# Patient Record
Sex: Female | Born: 1965 | Race: Black or African American | Hispanic: No | Marital: Married | Smoking: Never smoker
Health system: Southern US, Community
[De-identification: ages and names within clinical notes are randomized; demographics above are authoritative.]

## PROBLEM LIST (undated history)

## (undated) DIAGNOSIS — R51 Headache: Secondary | ICD-10-CM

## (undated) HISTORY — DX: Headache: R51

## (undated) HISTORY — PX: TUBAL LIGATION: SHX77

---

## 1997-08-10 ENCOUNTER — Ambulatory Visit (HOSPITAL_COMMUNITY): Admission: RE | Admit: 1997-08-10 | Discharge: 1997-08-10 | Payer: Self-pay | Admitting: Obstetrics

## 1997-12-13 ENCOUNTER — Inpatient Hospital Stay (HOSPITAL_COMMUNITY): Admission: AD | Admit: 1997-12-13 | Discharge: 1997-12-13 | Payer: Self-pay | Admitting: Obstetrics

## 1998-07-22 ENCOUNTER — Other Ambulatory Visit: Admission: RE | Admit: 1998-07-22 | Discharge: 1998-07-22 | Payer: Self-pay | Admitting: Obstetrics

## 1999-06-22 ENCOUNTER — Other Ambulatory Visit: Admission: RE | Admit: 1999-06-22 | Discharge: 1999-06-22 | Payer: Self-pay | Admitting: Obstetrics

## 1999-06-26 ENCOUNTER — Emergency Department (HOSPITAL_COMMUNITY): Admission: EM | Admit: 1999-06-26 | Discharge: 1999-06-26 | Payer: Self-pay | Admitting: *Deleted

## 2000-01-13 ENCOUNTER — Encounter: Payer: Self-pay | Admitting: Internal Medicine

## 2000-05-03 ENCOUNTER — Emergency Department (HOSPITAL_COMMUNITY): Admission: EM | Admit: 2000-05-03 | Discharge: 2000-05-03 | Payer: Self-pay | Admitting: Emergency Medicine

## 2000-08-09 ENCOUNTER — Encounter: Payer: Self-pay | Admitting: Family Medicine

## 2000-08-09 ENCOUNTER — Ambulatory Visit (HOSPITAL_COMMUNITY): Admission: RE | Admit: 2000-08-09 | Discharge: 2000-08-09 | Payer: Self-pay | Admitting: Family Medicine

## 2000-08-13 ENCOUNTER — Ambulatory Visit (HOSPITAL_COMMUNITY): Admission: RE | Admit: 2000-08-13 | Discharge: 2000-08-13 | Payer: Self-pay | Admitting: Family Medicine

## 2000-08-13 ENCOUNTER — Encounter: Payer: Self-pay | Admitting: Family Medicine

## 2000-10-09 ENCOUNTER — Encounter: Payer: Self-pay | Admitting: Obstetrics

## 2000-10-09 ENCOUNTER — Encounter: Admission: RE | Admit: 2000-10-09 | Discharge: 2000-10-09 | Payer: Self-pay | Admitting: Obstetrics

## 2000-10-31 ENCOUNTER — Encounter: Payer: Self-pay | Admitting: Internal Medicine

## 2001-03-21 ENCOUNTER — Encounter: Payer: Self-pay | Admitting: Family Medicine

## 2001-03-21 ENCOUNTER — Encounter: Admission: RE | Admit: 2001-03-21 | Discharge: 2001-03-21 | Payer: Self-pay | Admitting: Family Medicine

## 2001-10-23 ENCOUNTER — Encounter: Admission: RE | Admit: 2001-10-23 | Discharge: 2001-10-23 | Payer: Self-pay | Admitting: General Surgery

## 2001-10-23 ENCOUNTER — Encounter (HOSPITAL_BASED_OUTPATIENT_CLINIC_OR_DEPARTMENT_OTHER): Payer: Self-pay | Admitting: General Surgery

## 2002-03-12 ENCOUNTER — Encounter: Payer: Self-pay | Admitting: Internal Medicine

## 2002-03-26 ENCOUNTER — Encounter: Payer: Self-pay | Admitting: Internal Medicine

## 2002-03-28 ENCOUNTER — Encounter: Payer: Self-pay | Admitting: Internal Medicine

## 2002-04-18 ENCOUNTER — Encounter: Payer: Self-pay | Admitting: Internal Medicine

## 2002-06-09 ENCOUNTER — Encounter: Payer: Self-pay | Admitting: Obstetrics

## 2002-06-09 ENCOUNTER — Ambulatory Visit (HOSPITAL_COMMUNITY): Admission: RE | Admit: 2002-06-09 | Discharge: 2002-06-09 | Payer: Self-pay | Admitting: Obstetrics

## 2002-06-30 ENCOUNTER — Inpatient Hospital Stay (HOSPITAL_COMMUNITY): Admission: AD | Admit: 2002-06-30 | Discharge: 2002-06-30 | Payer: Self-pay | Admitting: Obstetrics

## 2002-07-07 ENCOUNTER — Inpatient Hospital Stay (HOSPITAL_COMMUNITY): Admission: AD | Admit: 2002-07-07 | Discharge: 2002-07-07 | Payer: Self-pay | Admitting: Obstetrics

## 2002-07-14 ENCOUNTER — Inpatient Hospital Stay (HOSPITAL_COMMUNITY): Admission: RE | Admit: 2002-07-14 | Discharge: 2002-07-14 | Payer: Self-pay | Admitting: Obstetrics

## 2002-07-14 ENCOUNTER — Encounter: Payer: Self-pay | Admitting: Obstetrics

## 2002-07-21 ENCOUNTER — Inpatient Hospital Stay (HOSPITAL_COMMUNITY): Admission: AD | Admit: 2002-07-21 | Discharge: 2002-07-21 | Payer: Self-pay | Admitting: Obstetrics

## 2002-07-23 ENCOUNTER — Encounter: Payer: Self-pay | Admitting: Obstetrics

## 2002-07-23 ENCOUNTER — Ambulatory Visit (HOSPITAL_COMMUNITY): Admission: RE | Admit: 2002-07-23 | Discharge: 2002-07-23 | Payer: Self-pay | Admitting: Obstetrics

## 2002-08-27 ENCOUNTER — Inpatient Hospital Stay (HOSPITAL_COMMUNITY): Admission: AD | Admit: 2002-08-27 | Discharge: 2002-08-27 | Payer: Self-pay | Admitting: Obstetrics

## 2002-09-24 ENCOUNTER — Inpatient Hospital Stay (HOSPITAL_COMMUNITY): Admission: AD | Admit: 2002-09-24 | Discharge: 2002-09-24 | Payer: Self-pay | Admitting: Obstetrics

## 2002-09-25 ENCOUNTER — Inpatient Hospital Stay (HOSPITAL_COMMUNITY): Admission: AD | Admit: 2002-09-25 | Discharge: 2002-09-25 | Payer: Self-pay | Admitting: Obstetrics

## 2002-10-19 ENCOUNTER — Inpatient Hospital Stay (HOSPITAL_COMMUNITY): Admission: AD | Admit: 2002-10-19 | Discharge: 2002-10-19 | Payer: Self-pay | Admitting: Obstetrics

## 2002-11-12 ENCOUNTER — Inpatient Hospital Stay (HOSPITAL_COMMUNITY): Admission: AD | Admit: 2002-11-12 | Discharge: 2002-11-12 | Payer: Self-pay | Admitting: Obstetrics

## 2002-11-14 ENCOUNTER — Ambulatory Visit (HOSPITAL_COMMUNITY): Admission: RE | Admit: 2002-11-14 | Discharge: 2002-11-14 | Payer: Self-pay | Admitting: Obstetrics

## 2002-11-14 ENCOUNTER — Encounter: Payer: Self-pay | Admitting: Obstetrics

## 2002-12-02 ENCOUNTER — Inpatient Hospital Stay (HOSPITAL_COMMUNITY): Admission: AD | Admit: 2002-12-02 | Discharge: 2002-12-02 | Payer: Self-pay | Admitting: Obstetrics

## 2002-12-11 ENCOUNTER — Inpatient Hospital Stay (HOSPITAL_COMMUNITY): Admission: AD | Admit: 2002-12-11 | Discharge: 2002-12-11 | Payer: Self-pay | Admitting: Obstetrics

## 2002-12-19 ENCOUNTER — Inpatient Hospital Stay (HOSPITAL_COMMUNITY): Admission: AD | Admit: 2002-12-19 | Discharge: 2002-12-22 | Payer: Self-pay | Admitting: Obstetrics

## 2002-12-19 ENCOUNTER — Encounter (INDEPENDENT_AMBULATORY_CARE_PROVIDER_SITE_OTHER): Payer: Self-pay | Admitting: Specialist

## 2003-07-29 ENCOUNTER — Encounter: Admission: RE | Admit: 2003-07-29 | Discharge: 2003-07-29 | Payer: Self-pay | Admitting: Obstetrics

## 2003-09-14 ENCOUNTER — Emergency Department (HOSPITAL_COMMUNITY): Admission: EM | Admit: 2003-09-14 | Discharge: 2003-09-14 | Payer: Self-pay | Admitting: Emergency Medicine

## 2004-03-29 ENCOUNTER — Ambulatory Visit: Payer: Self-pay | Admitting: Internal Medicine

## 2006-06-08 ENCOUNTER — Emergency Department (HOSPITAL_COMMUNITY): Admission: EM | Admit: 2006-06-08 | Discharge: 2006-06-08 | Payer: Self-pay | Admitting: Emergency Medicine

## 2006-07-05 ENCOUNTER — Encounter: Admission: RE | Admit: 2006-07-05 | Discharge: 2006-07-05 | Payer: Self-pay | Admitting: Internal Medicine

## 2006-07-05 ENCOUNTER — Ambulatory Visit: Payer: Self-pay | Admitting: Internal Medicine

## 2006-07-06 ENCOUNTER — Ambulatory Visit: Payer: Self-pay | Admitting: Internal Medicine

## 2006-07-06 LAB — CONVERTED CEMR LAB
AST: 14 units/L (ref 0–37)
Albumin: 3.4 g/dL — ABNORMAL LOW (ref 3.5–5.2)
Alkaline Phosphatase: 42 units/L (ref 39–117)
Bilirubin Urine: NEGATIVE
CO2: 31 meq/L (ref 19–32)
Calcium: 8.7 mg/dL (ref 8.4–10.5)
Cholesterol: 194 mg/dL (ref 0–200)
Creatinine, Ser: 0.8 mg/dL (ref 0.4–1.2)
Glucose, Bld: 95 mg/dL (ref 70–99)
HDL: 41.5 mg/dL (ref >39.0)
Hemoglobin: 12 g/dL (ref 12.0–15.0)
Hgb A1c MFr Bld: 5.8 % (ref 4.6–6.0)
LDL Cholesterol: 128 mg/dL — ABNORMAL HIGH (ref 0–99)
Lymphocytes Relative: 28.6 % (ref 12.0–46.0)
MCV: 81.2 fL (ref 78.0–100.0)
Neutro Abs: 3.8 10*3/uL (ref 1.4–7.7)
Platelets: 255 10*3/uL (ref 150–400)
Potassium: 3.9 meq/L (ref 3.5–5.1)
RBC: 4.26 M/uL (ref 3.87–5.11)
Total CHOL/HDL Ratio: 4.7
Total Protein: 7.3 g/dL (ref 6.0–8.3)
Triglycerides: 125 mg/dL (ref 0–149)
Urine Glucose: NEGATIVE mg/dL
VLDL: 25 mg/dL (ref 0–40)
WBC: 6.2 10*3/uL (ref 4.5–10.5)
pH: 6 (ref 5.0–8.0)

## 2006-07-17 ENCOUNTER — Ambulatory Visit: Payer: Self-pay | Admitting: Internal Medicine

## 2006-07-31 ENCOUNTER — Ambulatory Visit (HOSPITAL_COMMUNITY): Admission: RE | Admit: 2006-07-31 | Discharge: 2006-07-31 | Payer: Self-pay | Admitting: Neurology

## 2006-08-03 ENCOUNTER — Ambulatory Visit (HOSPITAL_COMMUNITY): Admission: RE | Admit: 2006-08-03 | Discharge: 2006-08-03 | Payer: Self-pay | Admitting: Neurology

## 2006-08-20 ENCOUNTER — Encounter: Payer: Self-pay | Admitting: Internal Medicine

## 2006-12-08 ENCOUNTER — Emergency Department (HOSPITAL_COMMUNITY): Admission: EM | Admit: 2006-12-08 | Discharge: 2006-12-08 | Payer: Self-pay | Admitting: Emergency Medicine

## 2006-12-27 ENCOUNTER — Ambulatory Visit: Payer: Self-pay | Admitting: Internal Medicine

## 2007-01-24 ENCOUNTER — Telehealth: Payer: Self-pay | Admitting: Internal Medicine

## 2007-02-01 ENCOUNTER — Encounter: Payer: Self-pay | Admitting: Internal Medicine

## 2007-02-01 DIAGNOSIS — R519 Headache, unspecified: Secondary | ICD-10-CM | POA: Insufficient documentation

## 2007-02-01 DIAGNOSIS — R51 Headache: Secondary | ICD-10-CM | POA: Insufficient documentation

## 2007-08-17 ENCOUNTER — Inpatient Hospital Stay (HOSPITAL_COMMUNITY): Admission: AD | Admit: 2007-08-17 | Discharge: 2007-08-17 | Payer: Self-pay | Admitting: Obstetrics

## 2007-12-20 ENCOUNTER — Ambulatory Visit (HOSPITAL_BASED_OUTPATIENT_CLINIC_OR_DEPARTMENT_OTHER): Admission: RE | Admit: 2007-12-20 | Discharge: 2007-12-20 | Payer: Self-pay | Admitting: Internal Medicine

## 2007-12-20 ENCOUNTER — Emergency Department (HOSPITAL_BASED_OUTPATIENT_CLINIC_OR_DEPARTMENT_OTHER): Admission: EM | Admit: 2007-12-20 | Discharge: 2007-12-20 | Payer: Self-pay | Admitting: Emergency Medicine

## 2007-12-20 ENCOUNTER — Ambulatory Visit: Payer: Self-pay | Admitting: Internal Medicine

## 2007-12-20 DIAGNOSIS — R0602 Shortness of breath: Secondary | ICD-10-CM | POA: Insufficient documentation

## 2007-12-20 DIAGNOSIS — H9209 Otalgia, unspecified ear: Secondary | ICD-10-CM | POA: Insufficient documentation

## 2007-12-20 DIAGNOSIS — R5381 Other malaise: Secondary | ICD-10-CM

## 2007-12-20 DIAGNOSIS — R5383 Other fatigue: Secondary | ICD-10-CM | POA: Insufficient documentation

## 2007-12-20 LAB — CONVERTED CEMR LAB
AST: 17 units/L (ref 0–37)
Basophils Absolute: 0 10*3/uL (ref 0.0–0.1)
Calcium: 8.6 mg/dL (ref 8.4–10.5)
Creatinine, Ser: 0.8 mg/dL (ref 0.4–1.2)
Eosinophils Absolute: 0.1 10*3/uL (ref 0.0–0.7)
HCT: 38.7 % (ref 36.0–46.0)
Hemoglobin: 13.2 g/dL (ref 12.0–15.0)
Lymphocytes Relative: 31.6 % (ref 12.0–46.0)
MCHC: 34.1 g/dL (ref 30.0–36.0)
Neutro Abs: 4.1 10*3/uL (ref 1.4–7.7)
Platelets: 291 10*3/uL (ref 150–400)
Potassium: 3.7 meq/L (ref 3.5–5.1)
RBC: 4.7 M/uL (ref 3.87–5.11)
TSH: 1.44 microintl units/mL (ref 0.35–5.50)
WBC: 6.7 10*3/uL (ref 4.5–10.5)

## 2008-05-21 ENCOUNTER — Emergency Department (HOSPITAL_COMMUNITY): Admission: EM | Admit: 2008-05-21 | Discharge: 2008-05-21 | Payer: Self-pay | Admitting: Emergency Medicine

## 2008-08-10 ENCOUNTER — Emergency Department (HOSPITAL_COMMUNITY): Admission: EM | Admit: 2008-08-10 | Discharge: 2008-08-10 | Payer: Self-pay | Admitting: Emergency Medicine

## 2009-06-02 ENCOUNTER — Ambulatory Visit: Payer: Self-pay | Admitting: Internal Medicine

## 2009-06-02 DIAGNOSIS — E669 Obesity, unspecified: Secondary | ICD-10-CM | POA: Insufficient documentation

## 2009-06-02 DIAGNOSIS — M722 Plantar fascial fibromatosis: Secondary | ICD-10-CM | POA: Insufficient documentation

## 2009-06-02 DIAGNOSIS — R609 Edema, unspecified: Secondary | ICD-10-CM | POA: Insufficient documentation

## 2009-06-02 LAB — CONVERTED CEMR LAB
BUN: 10 mg/dL (ref 6–23)
Calcium: 8.7 mg/dL (ref 8.4–10.5)
Glucose, Urine, Semiquant: NEGATIVE
Ketones, urine, test strip: NEGATIVE
Potassium: 4 meq/L (ref 3.5–5.3)
Sodium: 140 meq/L (ref 135–145)
WBC Urine, dipstick: NEGATIVE
pH: 5

## 2009-06-03 ENCOUNTER — Encounter: Payer: Self-pay | Admitting: Internal Medicine

## 2009-09-20 ENCOUNTER — Inpatient Hospital Stay (HOSPITAL_COMMUNITY): Admission: AD | Admit: 2009-09-20 | Discharge: 2009-09-20 | Payer: Self-pay | Admitting: Obstetrics & Gynecology

## 2009-09-20 ENCOUNTER — Ambulatory Visit: Payer: Self-pay | Admitting: Obstetrics and Gynecology

## 2010-03-31 ENCOUNTER — Ambulatory Visit (INDEPENDENT_AMBULATORY_CARE_PROVIDER_SITE_OTHER)
Admission: RE | Admit: 2010-03-31 | Discharge: 2010-03-31 | Payer: BC Managed Care – PPO | Source: Home / Self Care | Attending: Internal Medicine | Admitting: Internal Medicine

## 2010-03-31 DIAGNOSIS — H9209 Otalgia, unspecified ear: Secondary | ICD-10-CM

## 2010-03-31 DIAGNOSIS — R7309 Other abnormal glucose: Secondary | ICD-10-CM

## 2010-03-31 LAB — HM MAMMOGRAPHY

## 2010-03-31 LAB — CONVERTED CEMR LAB

## 2010-04-05 NOTE — Letter (Signed)
   [place] [facility] [address] [place], [place]  [address]  [place] Phone: [phone]      June 03, 2009   [place] [address] [place], [place] [address]  RE:  LAB RESULTS  Dear  Ms. Horsman,  The following is an interpretation of your most recent lab tests.  Please take note of any instructions provided or changes to medications that have resulted from your lab work.  ELECTROLYTES:  Good - no changes needed     Diabetes blood test - HgA1c 6.0  (borderline)       Sincerely Yours,    Dr. Thomos Lemons

## 2010-04-05 NOTE — Assessment & Plan Note (Signed)
Summary: Multiple complaints /mhf   Vital Signs:  Patient profile:   45 year old female Height:      66 inches Weight:      265.50 pounds BMI:     43.01 O2 Sat:      99 % on Room air Temp:     98.0 degrees F oral Pulse rate:   54 / minute Pulse rhythm:   regular Resp:     16 per minute BP sitting:   114 / 70  (right arm) Cuff size:   large  Vitals Entered By: Glendell Docker CMA (June 02, 2009 8:48 AM)  O2 Flow:  Room air CC: Rm 2-Re-establish care   Primary Care Provider:  DThomos Lemons DO  CC:  Rm 2-Re-establish care.  History of Present Illness: 45 y/o AA female - we haven't seen over 2 yrs  she complains of left foot pain localized to heel.  worse with wt bearing. she also complains of bilateral leg swelling  20 lbs wt gain since prev visit.  she c/o urinary frequency occasional right pelvic discomfort    Preventive Screening-Counseling & Management  Alcohol-Tobacco     Alcohol drinks/day: 0     Smoking Status: never  Caffeine-Diet-Exercise     Caffeine use/day: 1 per week     Does Patient Exercise: no  Allergies (verified): No Known Drug Allergies  Past History:  Past Medical History: Hx of severe HA - with abnormal MRI ? 2006/07/19       Tubular flow signal void in the region of the left sigmoid sinus.        ? filling defect within the left transverse sinus such as due to clot.   S/P Cerebral angio -       1.  Normal-appearing arterial phase.      2.  Early venous and delayed venous phases demonstrating no evidence      of dural venous sinus thrombus or dural venous sinus narrowing.      Normal hemodynamic progression of contrast from the capillary and      venous phases. Hx of SOB - cardiology evaluation 07-19-02 [facility])   - normal stress test - EF 60% (Dr. Fraser Din)   - normal 2D Echo.  mild pulmonic insuff,  trivial tricuspit regurg   Negative CT of Chest for PE 07/19/2007  Past Surgical History: Caesarean section  Tubal  ligation  Family History: DM - mother CAD - Father , sister   Social History: Widowed - 07/19/07 Husband died of liver failure 1 daughter, 2 sons works part time in Theatre stage manager - Event organiser Never Smoked Alcohol use-no Caffeine use/day:  1 per week Does Patient Exercise:  no  Review of Systems       The patient complains of weight gain.  The patient denies chest pain, prolonged cough, abdominal pain, melena, hematochezia, and severe indigestion/heartburn.    Physical Exam  General:  alert and overweight-appearing.   Head:  normocephalic and atraumatic.   Neck:  supple and no masses.   Lungs:  normal respiratory effort, normal breath sounds, no crackles, and no wheezes.   Heart:  normal rate, regular rhythm, and no gallop.   Abdomen:  obese,  mild RLQ pain,  no masses, no guarding, no rigidity, and no rebound tenderness.   Msk:  left heel tendnerness.  left ankle - no joint swelling, no joint warmth, and no joint instability.   Extremities:  1+ left pedal edema and  1+ right pedal edema.  no calf tenderness   Impression & Recommendations:  Problem # 1:  PLANTAR FASCIITIS (ICD-728.71) 45 y/o with left foot pain c/w plantar fascitis.  I reviewed stretching exercises and use of shoe inserts.    Problem # 2:  OBESITY (ICD-278.00) I counseled pt on diet and exercise.  screen for diabetes.    Orders: T-Basic Metabolic Panel [phone]) T- Hemoglobin A1C ([id number]) T-TSH ([id number])  Problem # 3:  EDEMA (ICD-782.3) Pt with lower ext edema.  I suspect secondary to venous insuff and obesity.   follow low salt diet.  Other Orders: Misc. Referral (Misc. Ref) UA Dipstick w/o Micro (manual) ([id number])  Patient Instructions: 1)  Perform left foot stretching exercises as directed. 2)  Wear shoes with good arch support. 3)  [email or web address]-calorie-counter.com/ 4)  Limit your calories to approx 1300 cal per day. 5)  Our office will contact you re:  referral to health  serve clinic 6)  Restrict sodium intake to 2 - 2.5 grams per day.  Current Allergies (reviewed today): No known allergies        Laboratory Results   Urine Tests    Routine Urinalysis   Color: yellow Appearance: Clear Glucose: negative   (Normal Range: Negative) Bilirubin: negative   (Normal Range: Negative) Ketone: negative   (Normal Range: Negative) Spec. Gravity: 1.015   (Normal Range: 1.003-1.035) Blood: negative   (Normal Range: Negative) pH: 5.0   (Normal Range: 5.0-8.0) Protein: negative   (Normal Range: Negative) Urobilinogen: 0.2   (Normal Range: 0-1) Nitrite: negative   (Normal Range: Negative) Leukocyte Esterace: negative   (Normal Range: Negative)

## 2010-04-21 NOTE — Assessment & Plan Note (Signed)
Summary: Pt need wt loss help/knee pain/hea   Vital Signs:  Patient profile:   45 year old female Height:      66 inches Weight:      260 pounds BMI:     42.12 O2 Sat:      99 % on Room air Temp:     97.9 degrees F oral Pulse rate:   64 / minute Resp:     16 per minute BP sitting:   100 / 70  (left arm) Cuff size:   large  Vitals Entered By: Glendell Docker CMA (March 31, 2010 4:01 PM)  O2 Flow:  Room air CC: Help with Weight Is Patient Diabetic? No Pain Assessment Patient in pain? no          Last PAP Result due   Primary Care Provider:  Dondra Spry DO  CC:  Help with Weight.  History of Present Illness:  45 y/o AA female for f/u pt has been trying to lose wt by diet and exercise but little progress she is not ready to consider wt loss surgery pt with multiple fam members with severe obesity  pt c/o chronic left knee pain   she also c/o rattling in left ear for the past 2 weeks she has seen ent in the past.  prev procedure delayed by pt  Preventive Screening-Counseling & Management  Alcohol-Tobacco     Smoking Status: never  Allergies (verified): No Known Drug Allergies  Past History:  Past Medical History: Hx of severe HA - with abnormal MRI ? 07/2006       Tubular flow signal void in the region of the left sigmoid sinus.        ? filling defect within the left transverse sinus such as due to clot.   S/P Cerebral angio -        1.  Normal-appearing arterial phase.      2.  Early venous and delayed venous phases demonstrating no evidence      of dural venous sinus thrombus or dural venous sinus narrowing.      Normal hemodynamic progression of contrast from the capillary and      venous phases. Hx of SOB - cardiology evaluation 2004 [facility])   - normal stress test - EF 60% (Dr. Fraser Din)   - normal 2D Echo.  mild pulmonic insuff,  trivial tricuspit regurg   Negative CT of Chest for PE -  2009 Hx of left ear drum rupture  Past  Surgical History: Caesarean section  Tubal ligation    Physical Exam  General:  alert, well-developed, and well-nourished.   Lungs:  normal respiratory effort and normal breath sounds.   Heart:  normal rate, regular rhythm, and no gallop.   Extremities:  trace left pedal edema and trace right pedal edema.     Impression & Recommendations:  Problem # 1:  OTHER ABNORMAL GLUCOSE (ICD-790.29) pt struggling with obesity  A1c elevated I advised against use of phentermine use metformin as directed track calorie intake continue regular exercise  Problem # 2:  EAR PAIN, LEFT (ICD-388.70) follow up with ENT   Orders Added: 1)  Est. Patient Level III [42683]   Immunization History:  Influenza Immunization History:    Influenza:  declined (03/31/2010)   Contraindications/Deferment of Procedures/Staging:    Test/Procedure: FLU VAX    Reason for deferment: patient declined   Immunization History:  Influenza Immunization History:    Influenza:  Declined (03/31/2010)  Current Allergies (  reviewed today): No known allergies    Preventive Care Screening  Mammogram:    Date:  03/31/2010    Results:  due  Pap Smear:    Date:  03/31/2010    Results:  due

## 2010-05-21 LAB — URINALYSIS, ROUTINE W REFLEX MICROSCOPIC
Hgb urine dipstick: NEGATIVE
Nitrite: NEGATIVE
pH: 6 (ref 5.0–8.0)

## 2010-05-21 LAB — GC/CHLAMYDIA PROBE AMP, GENITAL: GC Probe Amp, Genital: NEGATIVE

## 2010-05-21 LAB — POCT PREGNANCY, URINE: Preg Test, Ur: NEGATIVE

## 2010-05-21 LAB — WET PREP, GENITAL: Yeast Wet Prep HPF POC: NONE SEEN

## 2010-05-24 ENCOUNTER — Telehealth: Payer: Self-pay | Admitting: Internal Medicine

## 2010-05-24 ENCOUNTER — Inpatient Hospital Stay (INDEPENDENT_AMBULATORY_CARE_PROVIDER_SITE_OTHER)
Admission: RE | Admit: 2010-05-24 | Discharge: 2010-05-24 | Disposition: A | Discharge status: Home / Self Care | Payer: BC Managed Care – PPO | Source: Ambulatory Visit | Attending: Family Medicine | Admitting: Family Medicine

## 2010-05-24 DIAGNOSIS — H7290 Unspecified perforation of tympanic membrane, unspecified ear: Secondary | ICD-10-CM

## 2010-06-13 LAB — POCT I-STAT, CHEM 8
BUN: 7 mg/dL (ref 6–23)
Calcium, Ion: 1.19 mmol/L (ref 1.12–1.32)
Chloride: 103 mEq/L (ref 96–112)
Creatinine, Ser: 1 mg/dL (ref 0.4–1.2)
Glucose, Bld: 88 mg/dL (ref 70–99)
HCT: 41 % (ref 36.0–46.0)
Hemoglobin: 13.9 g/dL (ref 12.0–15.0)
Potassium: 3.9 meq/L (ref 3.5–5.1)
Sodium: 140 mEq/L (ref 135–145)
TCO2: 28 mmol/L (ref 0–100)

## 2010-06-16 LAB — URINALYSIS, ROUTINE W REFLEX MICROSCOPIC
Hgb urine dipstick: NEGATIVE
Nitrite: NEGATIVE
Protein, ur: NEGATIVE mg/dL
pH: 6.5 (ref 5.0–8.0)

## 2010-06-16 LAB — COMPREHENSIVE METABOLIC PANEL
Albumin: 3.3 g/dL — ABNORMAL LOW (ref 3.5–5.2)
BUN: 7 mg/dL (ref 6–23)
Calcium: 8.9 mg/dL (ref 8.4–10.5)
Glucose, Bld: 111 mg/dL — ABNORMAL HIGH (ref 70–99)
Sodium: 140 mEq/L (ref 135–145)
Total Protein: 7.1 g/dL (ref 6.0–8.3)

## 2010-06-16 LAB — DIFFERENTIAL
Lymphs Abs: 1.3 10*3/uL (ref 0.7–4.0)
Monocytes Relative: 8 % (ref 3–12)
Neutro Abs: 5 10*3/uL (ref 1.7–7.7)
Neutrophils Relative %: 72 % (ref 43–77)

## 2010-06-16 LAB — CBC: RBC: 5 MIL/uL (ref 3.87–5.11)

## 2010-06-26 ENCOUNTER — Emergency Department (HOSPITAL_COMMUNITY): Payer: BC Managed Care – PPO

## 2010-06-26 ENCOUNTER — Emergency Department (HOSPITAL_COMMUNITY)
Admission: EM | Admit: 2010-06-26 | Discharge: 2010-06-26 | Disposition: A | Discharge status: Home / Self Care | Payer: BC Managed Care – PPO | Attending: Emergency Medicine | Admitting: Emergency Medicine

## 2010-06-26 DIAGNOSIS — E119 Type 2 diabetes mellitus without complications: Secondary | ICD-10-CM | POA: Insufficient documentation

## 2010-06-26 DIAGNOSIS — R079 Chest pain, unspecified: Secondary | ICD-10-CM | POA: Insufficient documentation

## 2010-06-26 LAB — POCT CARDIAC MARKERS
CKMB, poc: <1 ng/mL — ABNORMAL LOW (ref 1.0–8.0)
Troponin i, poc: <0.05 ng/mL (ref 0.00–0.09)

## 2010-06-26 LAB — CBC
HCT: 39 % (ref 36.0–46.0)
Hemoglobin: 12.8 g/dL (ref 12.0–15.0)
MCV: 80.6 fL (ref 78.0–100.0)
Platelets: 292 10*3/uL (ref 150–400)
RBC: 4.84 MIL/uL (ref 3.87–5.11)
WBC: 9.9 10*3/uL (ref 4.0–10.5)

## 2010-06-26 LAB — COMPREHENSIVE METABOLIC PANEL
Albumin: 3.1 g/dL — ABNORMAL LOW (ref 3.5–5.2)
Alkaline Phosphatase: 49 U/L (ref 39–117)
BUN: 13 mg/dL (ref 6–23)
Chloride: 106 mEq/L (ref 96–112)
Potassium: 3.8 mEq/L (ref 3.5–5.1)
Total Bilirubin: 0.5 mg/dL (ref 0.3–1.2)

## 2010-06-26 LAB — DIFFERENTIAL
Lymphocytes Relative: 30 % (ref 12–46)
Lymphs Abs: 3 10*3/uL (ref 0.7–4.0)
Neutro Abs: 6 10*3/uL (ref 1.7–7.7)
Neutrophils Relative %: 61 % (ref 43–77)

## 2010-06-26 LAB — PROTIME-INR: INR: 0.94 (ref 0.00–1.49)

## 2010-06-26 LAB — GLUCOSE, CAPILLARY: Glucose-Capillary: 104 mg/dL — ABNORMAL HIGH (ref 70–99)

## 2010-07-22 NOTE — H&P (Signed)
   NAME:  Shannon Rose, Shannon Rose                          ACCOUNT NO.:  [id number]   MEDICAL RECORD NO.:  [id number]                   PATIENT TYPE:  INP   LOCATION:  9135                                 FACILITY:  [facility]   PHYSICIAN:  Kathreen Cosier, M.D.           DATE OF BIRTH:  1965-05-22   DATE OF ADMISSION:  12/19/2002  DATE OF DISCHARGE:                                HISTORY & PHYSICAL   HISTORY OF PRESENT ILLNESS:  The patient is a 45 year old, gravida 6, para 2-  0-3-2, [facility] December 22, 2002, presenting for induction at term.  Negative  GBS.  Her cervix was 1 cm, presenting as vertex, -3 .  Membranes were  ruptured artificially at 7:40 a.m., fluid was clear and she was contracting  irregularly.  She had a history of herpes.  She has had no outbreaks  recently and she was on Valtrex 500 mg p.o. once daily.  The patient was  started on Pitocin, stimulation unknown.  Contractions were very mild.  She  was not feeling them.   By 5:15 p.m. her cervix was 4-5, 100%, vertex, -2 to 3.  IUPC and scalp lead  applied.  She was having early decelerations contracting q.1-2 minutes.  She  had a period where she had some hyperstimulation and the Pitocin was  discontinued for an hour and it was restarted an hour later at 2 mU/min.   By 7:30 p.m. she was 5 cm and she was having late decelerations with each  contraction, her MVUs were 170 and she was on O2, position change no  resolution, amnioinfusion and, because of previous variable decelerations,  it was decided she would be delivered by C-section because of late  deceleration and nonreassuring fetal heart rate tracing.  The patient also  desired sterilization.   PHYSICAL EXAMINATION:  GENERAL:  Obese female, in labor.  HEENT:  Negative.  LUNGS:  Clear.  HEART:  Regular rhythm; no murmurs; no gallops.  BREASTS:  No masses.  ABDOMEN:  Term, estimated fetal weight was 7 pounds.  EXTREMITIES:  Negative.              Kathreen Cosier, M.D.    BAM/MEDQ  D:  12/19/2002  T:  12/19/2002  Job:  [id number]

## 2010-07-22 NOTE — Discharge Summary (Signed)
   NAME:  CODA, FILLER                          ACCOUNT NO.:  [id number]   MEDICAL RECORD NO.:  [id number]                   PATIENT TYPE:  INP   LOCATION:  9110                                 [facility]ILITY[facility]  [facility]   PHYSICIAN:  Kathreen Cosier, M.D.           DATE OF BIRTH:  [birth year]   DATE OF ADMISSION:  12/19/2002  DATE OF DISCHARGE:                                 DISCHARGE SUMMARY   HOSPITAL COURSE:  The patient is a 45 year old gravida 6 para 2-0-3-2, [facility]  October 18, who was admitted for induction at term, negative GBS.  She had a  history of herpes, has been on Valtrex for the past four weeks.  On  admission cervix 1 cm, 70%, vertex, -3, and the patient also desired  sterilization.  She underwent a primary cesarean section because of late  decelerations, and she had a female, Apgars 9 and 9, weight of 6 pounds 15  ounces from the OP position.  She also had a tubal ligation performed.  Postoperatively she did well.  Her hemoglobin was 11.5.  She remained  afebrile and was discharged on postoperative day #3 on Tylox for pain and to  see me in six weeks.   DISCHARGE DIAGNOSES:  1. Status post primary low transverse cesarean section at term for     nonreassuring fetal heart rate tracing.  2. Tubal ligation for multiparity.                                               Kathreen Cosier, M.D.    BAM/MEDQ  D:  12/22/2002  T:  12/22/2002  Job:  [id number]

## 2010-07-22 NOTE — Op Note (Signed)
   NAME:  Shannon Rose, Shannon Rose                          ACCOUNT NO.:  [id number]   MEDICAL RECORD NO.:  [id number]                   PATIENT TYPE:  INP   LOCATION:  9135                                 [facility]ILITY[facility]   PHYSICIAN:  Kathreen Cosier, M.D.           DATE OF BIRTH:  January 31, 1966   DATE OF PROCEDURE:  12/19/2002  DATE OF DISCHARGE:                                 OPERATIVE REPORT   PREOPERATIVE DIAGNOSIS:  Nonreassuring fetal heart rate tracing.   SURGEON:  Kathreen Cosier, M.D.   ANESTHESIA:  Epidural.   DESCRIPTION OF PROCEDURE:  The patient was placed on the operating room  table in the supine position.  The abdomen was prepped and draped, bladder  emptied with a Foley catheter.  A transverse suprapubic incision made and  carried down to the rectus fascia.  The fascia cleaned inside the length of  the incision.  Recti muscles were retracted laterally.  Peritoneum incised  longitudinally.  Transverse incision made in the viscera above the bladder,  and the bladder mobilized anteriorly.  A transverse lower uterine incision  made; the fluid was clear.  The patient delivered from the OP position of a  female; Apgars 9 an 9, weighing 6 pounds 15 ounces.  The team was in  attendance.  The placenta was anterior and removed manually.  Uterine cavity  cleaned and dried with dry laps.  Uterine incision closed in one layer with  continuous suture of #1 chromic.  Hemostasis was satisfactory.  Bladder flap  reattached with 2-0 chromic.   Both ovaries were normal.  The right tube was grasped in the mid portion  with a Babcock clamp.  Then 0 plain suture placed at the mesosalpinx below  the portion of tube which was clamped; and this was tied.  Approximately one-  inch of tube was transected.  The procedure was done on the left side in  same fashion.   Lap and sponge counts were correct.  Hemostasis was satisfactory.  Abdomen  was closed in layers.  Peritoneum closed with  continuous suture of 0  chromic.  The fascia closed with continuous suture of 0 Dexon.  The skin was  closed with subcuticular stitch of 3-0 Monocryl.   ESTIMATED BLOOD LOSS:  600 cc.   DISPOSITION:  The patient tolerated the procedure well and was taken to the  recovery room in good condition.                                               Kathreen Cosier, M.D.    BAM/MEDQ  D:  12/19/2002  T:  12/19/2002  Job:  [id number]

## 2010-09-19 ENCOUNTER — Other Ambulatory Visit: Payer: Self-pay | Admitting: Obstetrics and Gynecology

## 2010-09-19 DIAGNOSIS — N63 Unspecified lump in unspecified breast: Secondary | ICD-10-CM

## 2010-09-21 ENCOUNTER — Ambulatory Visit
Admission: RE | Admit: 2010-09-21 | Discharge: 2010-09-21 | Disposition: A | Discharge status: Home / Self Care | Payer: BC Managed Care – PPO | Source: Ambulatory Visit | Attending: Obstetrics and Gynecology | Admitting: Obstetrics and Gynecology

## 2010-09-21 DIAGNOSIS — N63 Unspecified lump in unspecified breast: Secondary | ICD-10-CM

## 2010-10-04 ENCOUNTER — Emergency Department (HOSPITAL_COMMUNITY)
Admission: EM | Admit: 2010-10-04 | Discharge: 2010-10-05 | Disposition: A | Discharge status: Home / Self Care | Payer: BC Managed Care – PPO | Attending: Emergency Medicine | Admitting: Emergency Medicine

## 2010-10-04 ENCOUNTER — Emergency Department (HOSPITAL_COMMUNITY): Payer: BC Managed Care – PPO

## 2010-10-04 DIAGNOSIS — R110 Nausea: Secondary | ICD-10-CM | POA: Insufficient documentation

## 2010-10-04 DIAGNOSIS — E119 Type 2 diabetes mellitus without complications: Secondary | ICD-10-CM | POA: Insufficient documentation

## 2010-10-04 DIAGNOSIS — R0602 Shortness of breath: Secondary | ICD-10-CM | POA: Insufficient documentation

## 2010-10-04 DIAGNOSIS — T383X5A Adverse effect of insulin and oral hypoglycemic [antidiabetic] drugs, initial encounter: Secondary | ICD-10-CM | POA: Insufficient documentation

## 2010-10-04 DIAGNOSIS — M79609 Pain in unspecified limb: Secondary | ICD-10-CM | POA: Insufficient documentation

## 2010-10-04 DIAGNOSIS — R42 Dizziness and giddiness: Secondary | ICD-10-CM | POA: Insufficient documentation

## 2010-10-04 LAB — CBC
HCT: 39.2 % (ref 36.0–46.0)
Hemoglobin: 13.2 g/dL (ref 12.0–15.0)
RBC: 4.98 MIL/uL (ref 3.87–5.11)
WBC: 11.8 10*3/uL — ABNORMAL HIGH (ref 4.0–10.5)

## 2010-10-04 LAB — BASIC METABOLIC PANEL
BUN: 8 mg/dL (ref 6–23)
CO2: 29 mEq/L (ref 19–32)
Chloride: 101 mEq/L (ref 96–112)
Glucose, Bld: 94 mg/dL (ref 70–99)
Potassium: 3.8 mEq/L (ref 3.5–5.1)

## 2010-10-04 LAB — DIFFERENTIAL
Basophils Absolute: 0 10*3/uL (ref 0.0–0.1)
Lymphocytes Relative: 26 % (ref 12–46)
Monocytes Absolute: 0.8 10*3/uL (ref 0.1–1.0)
Neutro Abs: 7.8 10*3/uL — ABNORMAL HIGH (ref 1.7–7.7)

## 2010-12-01 LAB — URINALYSIS, ROUTINE W REFLEX MICROSCOPIC
Bilirubin Urine: NEGATIVE
Nitrite: NEGATIVE
Specific Gravity, Urine: 1.02
pH: 6

## 2010-12-01 LAB — WET PREP, GENITAL: Clue Cells Wet Prep HPF POC: NONE SEEN

## 2010-12-01 LAB — URINE MICROSCOPIC-ADD ON

## 2010-12-01 LAB — POCT PREGNANCY, URINE: Preg Test, Ur: NEGATIVE

## 2010-12-01 LAB — GC/CHLAMYDIA PROBE AMP, GENITAL: Chlamydia, DNA Probe: NEGATIVE

## 2010-12-06 LAB — BASIC METABOLIC PANEL
BUN: 8
Chloride: 102
GFR calc non Af Amer: >60
Potassium: 3.8
Sodium: 143

## 2010-12-06 LAB — CBC
HCT: 39.6
Hemoglobin: 13.4
MCHC: 33.8
MCV: 81.4
Platelets: 283
RBC: 4.87
RDW: 13.6
WBC: 9.1

## 2010-12-06 LAB — DIFFERENTIAL
Eosinophils Relative: 1
Lymphocytes Relative: 30
Lymphs Abs: 2.7
Monocytes Relative: 6

## 2010-12-06 LAB — D-DIMER, QUANTITATIVE: D-Dimer, Quant: 0.71 — ABNORMAL HIGH

## 2010-12-06 LAB — URINALYSIS, ROUTINE W REFLEX MICROSCOPIC
Bilirubin Urine: NEGATIVE
Hgb urine dipstick: NEGATIVE
Ketones, ur: NEGATIVE
Protein, ur: NEGATIVE
Urobilinogen, UA: 0.2

## 2010-12-06 LAB — POCT B-TYPE NATRIURETIC PEPTIDE (BNP): B Natriuretic Peptide, POC: 6.4

## 2010-12-15 LAB — CARBOXYHEMOGLOBIN
Carboxyhemoglobin: 1.1
Methemoglobin: 1.2
O2 Saturation: 95.4
Total hemoglobin: 13

## 2011-04-02 ENCOUNTER — Encounter (HOSPITAL_COMMUNITY): Payer: Self-pay | Admitting: Emergency Medicine

## 2011-04-02 ENCOUNTER — Emergency Department (HOSPITAL_COMMUNITY)
Admission: EM | Admit: 2011-04-02 | Discharge: 2011-04-03 | Disposition: A | Discharge status: Home / Self Care | Payer: BC Managed Care – PPO | Attending: Emergency Medicine | Admitting: Emergency Medicine

## 2011-04-02 DIAGNOSIS — R202 Paresthesia of skin: Secondary | ICD-10-CM

## 2011-04-02 DIAGNOSIS — R209 Unspecified disturbances of skin sensation: Secondary | ICD-10-CM | POA: Insufficient documentation

## 2011-04-02 NOTE — ED Notes (Signed)
Patient reports progressive loss of taste x 3 weeks and numbness of tongue/lips x 1 week. Reports able to feel touch, but feels like pins and needles around lips and tongue.

## 2011-04-02 NOTE — ED Provider Notes (Signed)
History     CSN: [id number]  Arrival date & time 04/02/11  2228   First MD Initiated Contact with Patient 04/02/11 2312      Chief Complaint  Patient presents with  . Numbness    of the lip and tong, left side numbness  . Dizziness  . Insomnia     HPI  History provided by the patient. Patient is a 46 year old female with no significant past medical history who presents with complaints of lower lip and tongue numbness and tingling for the past 2 weeks. Patient also reports loss of taste from her tongue. She states she first noticed symptoms when she went to [employer] and bit into a cheeseburger and was unable to taste this. Patient states that she has some waxing and waning of symptoms with more recent improvement. She denies having similar symptoms previously. Patient denies any aggravating or alleviating factors. Patient also reports having some recent difficulties with sleeping. She reports having only a few hours per night and frequent awakening. Patient also reports having occasional numbness in left extremity. She states this is improved by lifting her arm above her head and shake it out. Patient has no other significant past medical history.    History reviewed. No pertinent past medical history.  History reviewed. No pertinent past surgical history.  History reviewed. No pertinent family history.  History  Substance Use Topics  . Smoking status: Not on file  . Smokeless tobacco: Not on file  . Alcohol Use: No    OB History    Grav Para Term Preterm Abortions TAB SAB Ect Mult Living                  Review of Systems  Constitutional: Negative for fever, chills, diaphoresis and appetite change.  Respiratory: Negative for cough and shortness of breath.   Cardiovascular: Negative for chest pain.  Gastrointestinal: Negative for abdominal pain.  All other systems reviewed and are negative.    Allergies  Review of patient's allergies indicates no known  allergies.  Home Medications   Current Outpatient Rx  Name Route Sig Dispense Refill  . IBUPROFEN 200 MG PO TABS Oral Take 200 mg by mouth every 6 (six) hours as needed. Pain      BP 116/77  Pulse 71  Temp(Src) 97.7 F (36.5 C) (Oral)  Resp 16  Wt 276 lb (125.193 kg)  SpO2 98%  LMP 03/26/2011  Physical Exam  Nursing note and vitals reviewed. Constitutional: She is oriented to person, place, and time. She appears well-developed and well-nourished. No distress.  HENT:  Head: Normocephalic.  Eyes: EOM are normal. Pupils are equal, round, and reactive to light.  Neck: Normal range of motion. Neck supple. No thyromegaly present.       No midline tenderness  Cardiovascular: Normal rate and regular rhythm.   No murmur heard. Pulmonary/Chest: Effort normal and breath sounds normal. No respiratory distress. She has no wheezes.  Abdominal: Soft. Bowel sounds are normal. She exhibits no distension. There is no tenderness.  Neurological: She is alert and oriented to person, place, and time. She has normal strength and normal reflexes. No cranial nerve deficit or sensory deficit. She displays a negative Romberg sign. Coordination and gait normal.       Patient reports slight numbness sensation to lower lip. Patient has normal smell  Skin: Skin is warm and dry. No rash noted.  Psychiatric: She has a normal mood and affect. Her behavior is normal.  ED Course  Procedures    Labs Reviewed  I-STAT, CHEM 8   I-STAT did not crossover and computer. Results are as follow:  Sodium 143, potassium 3.6, chloride 104, calcium 1.2, CO2 27, glucose 102, BUN 12, creatinine 1.0, hematocrit 39%, hemoglobin 13.3    1. Paresthesia       MDM  11:30 PM patient seen and evaluated. Patient in no acute distress. Patient with normal nonfocal neuro exam.   Pt discussed with Attending Physician.  They agree with workup and treatment plan.     Angus Seller, [place] 04/03/11 [phone]

## 2011-04-02 NOTE — ED Notes (Signed)
Pt states that for 2 weeks now s/s are present, pt reports numbness and tingling to the left side, lips and tongue, inability to sleep, states she is scared, pt reports recent loss in family, change in vision, blurred vision noted, neuro check are done, at this time no significant change, pt able to smile, even smile, able to move tongue in all directions, eye mucsle movement WNL. No dificulty ambulating at this time

## 2011-04-03 LAB — POCT I-STAT, CHEM 8
Calcium, Ion: 1.2 mmol/L (ref 1.12–1.32)
Glucose, Bld: 102 mg/dL — ABNORMAL HIGH (ref 70–99)
HCT: 39 % (ref 36.0–46.0)
Hemoglobin: 13.3 g/dL (ref 12.0–15.0)
Potassium: 3.6 mEq/L (ref 3.5–5.1)

## 2011-04-03 NOTE — ED Provider Notes (Signed)
Medical screening examination/treatment/procedure(s) were performed by non-physician practitioner and as supervising physician I was immediately available for consultation/collaboration.   Celene Kras, MD 04/03/11 [phone]

## 2011-06-15 ENCOUNTER — Emergency Department (HOSPITAL_COMMUNITY)
Admission: EM | Admit: 2011-06-15 | Discharge: 2011-06-15 | Disposition: A | Discharge status: Home Health | Payer: Self-pay | Attending: Emergency Medicine | Admitting: Emergency Medicine

## 2011-06-15 DIAGNOSIS — R0602 Shortness of breath: Secondary | ICD-10-CM | POA: Insufficient documentation

## 2011-06-15 DIAGNOSIS — R062 Wheezing: Secondary | ICD-10-CM | POA: Insufficient documentation

## 2011-06-15 DIAGNOSIS — M79609 Pain in unspecified limb: Secondary | ICD-10-CM | POA: Insufficient documentation

## 2011-06-15 MED ORDER — ALBUTEROL SULFATE (5 MG/ML) 0.5% IN NEBU
2.5000 mg | INHALATION_SOLUTION | Freq: Once | RESPIRATORY_TRACT | Status: AC
  Administered 2011-06-15: 5 mg via RESPIRATORY_TRACT

## 2011-06-15 MED ORDER — ALBUTEROL SULFATE HFA 108 (90 BASE) MCG/ACT IN AERS: 1.0000 | INHALATION_SPRAY | Freq: Four times a day (QID) | RESPIRATORY_TRACT | Status: DC | PRN

## 2011-06-15 MED ORDER — ALBUTEROL SULFATE (5 MG/ML) 0.5% IN NEBU
INHALATION_SOLUTION | RESPIRATORY_TRACT | Status: AC
  Filled 2011-06-15: qty 1

## 2011-06-15 MED ORDER — PREDNISONE 10 MG PO TABS: 20.0000 mg | ORAL_TABLET | Freq: Every day | ORAL | Status: DC

## 2011-06-15 MED ORDER — IPRATROPIUM BROMIDE 0.02 % IN SOLN
RESPIRATORY_TRACT | Status: AC
  Administered 2011-06-15: 0.5 mg via RESPIRATORY_TRACT
  Filled 2011-06-15: qty 2.5

## 2011-06-15 MED ORDER — IPRATROPIUM BROMIDE 0.02 % IN SOLN
0.5000 mg | Freq: Once | RESPIRATORY_TRACT | Status: AC
  Administered 2011-06-15: 0.5 mg via RESPIRATORY_TRACT

## 2011-06-15 MED ORDER — PREDNISONE 20 MG PO TABS
60.0000 mg | ORAL_TABLET | Freq: Once | ORAL | Status: AC
  Administered 2011-06-15: 60 mg via ORAL
  Filled 2011-06-15: qty 3

## 2011-06-15 MED ORDER — ALBUTEROL SULFATE HFA 108 (90 BASE) MCG/ACT IN AERS
2.0000 | INHALATION_SPRAY | RESPIRATORY_TRACT | Status: DC | PRN
  Administered 2011-06-15: 2 via RESPIRATORY_TRACT
  Filled 2011-06-15: qty 6.7

## 2011-06-15 NOTE — ED Notes (Signed)
Pt sitting on side of bed receiving breathing treatment.

## 2011-06-15 NOTE — ED Provider Notes (Signed)
History     CSN: [id number]  Arrival date & time 06/15/11  1220   First MD Initiated Contact with Patient 06/15/11 1237      Chief Complaint  Patient presents with  . Shortness of Breath     HPI Pt states he has been experiencing aching in his legs for past year. No known injuries. Reports L leg is especially painful over past several days. State he can "feel a lump" under skin in L anterior thigh. Ambulatory, [facility]  No past medical history on file.  No past surgical history on file.  No family history on file.  History  Substance Use Topics  . Smoking status: Not on file  . Smokeless tobacco: Not on file  . Alcohol Use: No    OB History    Grav Para Term Preterm Abortions TAB SAB Ect Mult Living                  Review of Systems  Unable to perform ROS: Other    Allergies  Review of patient's allergies indicates no known allergies.  Home Medications   Current Outpatient Rx  Name Route Sig Dispense Refill  . ALBUTEROL SULFATE HFA 108 (90 BASE) MCG/ACT IN AERS Inhalation Inhale 2 puffs into the lungs every 4 (four) hours as needed. For shortness of breath    . ALBUTEROL SULFATE HFA 108 (90 BASE) MCG/ACT IN AERS Inhalation Inhale 1-2 puffs into the lungs every 6 (six) hours as needed for wheezing. 1 Inhaler 0  . PREDNISONE 10 MG PO TABS Oral Take 2 tablets (20 mg total) by mouth daily. 15 tablet 0    BP 123/105  Pulse 101  Temp(Src) 98 F (36.7 C) (Oral)  Resp 24  SpO2 97%  Physical Exam  Nursing note and vitals reviewed. Constitutional: She is oriented to person, place, and time. She appears well-developed and well-nourished. No distress.  HENT:  Head: Normocephalic and atraumatic.  Eyes: Pupils are equal, round, and reactive to light.  Neck: Normal range of motion.  Cardiovascular: Normal rate and intact distal pulses.   Pulmonary/Chest: Accessory muscle usage present. She has wheezes.  Abdominal: Normal appearance. She exhibits no distension.    Musculoskeletal: Normal range of motion.  Neurological: She is alert and oriented to person, place, and time. No cranial nerve deficit.  Skin: Skin is warm and dry. No rash noted.  Psychiatric: She has a normal mood and affect. Her behavior is normal.    ED Course  Procedures (including critical care time) Scheduled Meds:    . albuterol  2.5 mg Nebulization Once  . ipratropium  0.5 mg Nebulization Once  . predniSONE  60 mg Oral Once   Continuous Infusions:  PRN Meds:.  Labs Reviewed - No data to display No results found.   1. Wheezing       MDM  After treatment in the ED the patient feels back to baseline and wants to go home.         Nelia Shi, MD 06/15/11 1253

## 2011-06-15 NOTE — ED Notes (Signed)
Pt reports onset SOB last night, unable to find her inhalers, expiratory wheezing and "Feels tight." pt can only speak in short phrases

## 2011-06-15 NOTE — Discharge Instructions (Signed)
Using Your Inhaler  1. Take the cap off the mouthpiece.  2. Shake the inhaler for 5 seconds.  3. Turn the inhaler so the bottle is above the mouthpiece. Hold it away from your mouth, at a distance of the width of 2 fingers.  4. Open your mouth widely, and tilt your head back slightly. Let your breath out.  5. Take a deep breath in slowly through your mouth. At the same time, push down on the bottle 1 time. You will feel the medicine enter your mouth and throat as you breathe.  6. Continue to take a deep breath in very slowly.  7. After you have breathed in completely, hold your breath for 10 seconds. This will help the medicine to settle in your lungs. If you cannot hold your breath for 10 seconds, hold it for as long as you can before you breathe out.  8. If your doctor has told you to take more than 1 puff, wait at least 1 minute between puffs. This will help you get the best results from your medicine.  9. If you use a steroid inhaler, rinse out your mouth after each dose.  10. Wash your inhaler once a day. Remove the bottle from the mouthpiece. Rinse the mouthpiece and cap with warm water. Dry everything well before you put the inhaler back together.  Document Released: 11/30/2007 Document Revised: 02/09/2011 Document Reviewed: 12/08/2008  [employer] Patient Information 2012 [employer].

## 2011-09-13 NOTE — Telephone Encounter (Signed)
Closing encounter

## 2011-09-21 ENCOUNTER — Other Ambulatory Visit: Payer: Self-pay

## 2011-09-26 ENCOUNTER — Other Ambulatory Visit (INDEPENDENT_AMBULATORY_CARE_PROVIDER_SITE_OTHER): Payer: Managed Care, Other (non HMO)

## 2011-09-26 ENCOUNTER — Other Ambulatory Visit: Payer: Self-pay

## 2011-09-26 DIAGNOSIS — Z0000 Encounter for general adult medical examination without abnormal findings: Secondary | ICD-10-CM

## 2011-09-26 LAB — CBC WITH DIFFERENTIAL/PLATELET
Basophils Relative: 0.4 % (ref 0.0–3.0)
Eosinophils Relative: 0.8 % (ref 0.0–5.0)
Lymphocytes Relative: 26 % (ref 12.0–46.0)
Monocytes Relative: 6.4 % (ref 3.0–12.0)
Neutrophils Relative %: 66.4 % (ref 43.0–77.0)
RBC: 5.05 Mil/uL (ref 3.87–5.11)
WBC: 8.8 10*3/uL (ref 4.5–10.5)

## 2011-09-26 LAB — POCT URINALYSIS DIPSTICK
Blood, UA: NEGATIVE
Glucose, UA: NEGATIVE
Nitrite, UA: NEGATIVE
Urobilinogen, UA: 0.2

## 2011-09-26 LAB — LIPID PANEL
LDL Cholesterol: 123 mg/dL — ABNORMAL HIGH (ref 0–99)
Total CHOL/HDL Ratio: 4
Triglycerides: 115 mg/dL (ref 0.0–149.0)

## 2011-09-26 LAB — HEPATIC FUNCTION PANEL
ALT: 8 U/L (ref 0–35)
AST: 16 U/L (ref 0–37)
Alkaline Phosphatase: 51 U/L (ref 39–117)
Bilirubin, Direct: 0 mg/dL (ref 0.0–0.3)
Total Protein: 8.3 g/dL (ref 6.0–8.3)

## 2011-09-26 LAB — BASIC METABOLIC PANEL
Calcium: 8.8 mg/dL (ref 8.4–10.5)
Creatinine, Ser: 0.8 mg/dL (ref 0.4–1.2)
Sodium: 138 mEq/L (ref 135–145)

## 2011-09-28 ENCOUNTER — Encounter: Payer: Self-pay | Admitting: Internal Medicine

## 2011-09-28 ENCOUNTER — Ambulatory Visit (INDEPENDENT_AMBULATORY_CARE_PROVIDER_SITE_OTHER): Payer: Managed Care, Other (non HMO) | Admitting: Internal Medicine

## 2011-09-28 VITALS — BP 114/78 | HR 68 | Temp 98.6°F | Ht 65.5 in | Wt 278.0 lb

## 2011-09-28 DIAGNOSIS — R0602 Shortness of breath: Secondary | ICD-10-CM

## 2011-09-28 DIAGNOSIS — Z0000 Encounter for general adult medical examination without abnormal findings: Secondary | ICD-10-CM

## 2011-09-28 NOTE — Assessment & Plan Note (Signed)
Her symptoms likely due to obesity.  Patient was evaluated in the emergency room 2 or 3 months ago for possible bronchitis/asthma flare. Her spirometry was normal today. She did have mild decrease in FEF 25-75%. If she has persistent symptoms suggestive of asthma, consider methacholine challenge test.

## 2011-09-28 NOTE — Progress Notes (Signed)
Subjective:    Patient ID: Shannon Rose, female    DOB: 11-28-1965, 46 y.o.   MRN: [id number]  HPI  46 year old African American female with history of borderline type 2 diabetes, obesity and shortness of breath for routine physical. Despite her weight loss efforts, patient has not been able to make any progress.  She has actually gained approximately 18 pounds since previous visit.  Patient reports episode of bronchitis to 3 months ago. She was seen in the emergency room. There was question of possible asthma. She was treated with prednisone and albuterol. Her symptoms have resolved. She denies any wheezing. She does have chronic dyspnea and exertion. She attributes her shortness of breath to obesity.  Review of Systems   Constitutional: Negative for activity change, appetite change  Eyes: Negative for visual disturbance.  Respiratory: Negative for cough, dyspnea with exertion Cardiovascular: Negative for chest pain.  Genitourinary: Negative for difficulty urinating.  Neurological: Negative for headaches.  Gastrointestinal: Negative for abdominal pain, heartburn melena or hematochezia Psych: Negative for depression or anxiety Endo:  No polyuria or polydypsia    Past Medical History  Diagnosis Date  . Headache     History   Social History  . Marital Status: Married    Spouse Name: N/A    Number of Children: N/A  . Years of Education: N/A   Occupational History  . Not on file.   Social History Main Topics  . Smoking status: Never Smoker   . Smokeless tobacco: Not on file  . Alcohol Use: No  . Drug Use: No  . Sexually Active: Yes   Other Topics Concern  . Not on file   Social History Narrative  . No narrative on file    Past Surgical History  Procedure Date  . Cesarean section   . Tubal ligation     Family History  Problem Relation Age of Onset  . Diabetes Mother   . Heart disease Father   . Heart disease Sister     No Known Allergies  Current  Outpatient Prescriptions on File Prior to Visit  Medication Sig Dispense Refill  . albuterol (PROVENTIL HFA;VENTOLIN HFA) 108 (90 BASE) MCG/ACT inhaler Inhale 1-2 puffs into the lungs every 6 (six) hours as needed for wheezing.  1 Inhaler  0  . DISCONTD: albuterol (PROVENTIL HFA;VENTOLIN HFA) 108 (90 BASE) MCG/ACT inhaler Inhale 2 puffs into the lungs every 4 (four) hours as needed. For shortness of breath        BP 114/78  Pulse 68  Temp 98.6 F (37 C) (Oral)  Ht 5' 5.5" (1.664 m)  Wt 278 lb (126.1 kg)  BMI 45.56 kg/m2  EKG shows normal sinus rhythm at 69 beats per minute. No ST changes. Spirometry was normal. She did have mild decrease in FEF 25-75%.     Objective:   Physical Exam  Constitutional: She is oriented to person, place, and time.       Obese, pleasant  HENT:  Head: Normocephalic and atraumatic.  Right Ear: External ear normal.  Mouth/Throat: Oropharynx is clear and moist.  Eyes: EOM are normal. Pupils are equal, round, and reactive to light.  Neck: Neck supple.  Cardiovascular: Normal rate, normal heart sounds and intact distal pulses.   No murmur heard. Pulmonary/Chest: Breath sounds normal. She has no wheezes.  Abdominal: Soft. Bowel sounds are normal. She exhibits no mass.  Musculoskeletal: She exhibits no edema.  Lymphadenopathy:    She has no cervical adenopathy.  Neurological: She  is alert and oriented to person, place, and time. No cranial nerve deficit.  Skin: Skin is warm and dry.  Psychiatric: She has a normal mood and affect. Her behavior is normal.          Assessment & Plan:

## 2011-09-28 NOTE — Assessment & Plan Note (Signed)
Reviewed adult health maintenance protocols.  We reviewed additional weight loss strategies. We also discussed referral for bariatric surgery.

## 2011-09-28 NOTE — Patient Instructions (Addendum)
Return in 1 month for spirometry test. Please complete your annual mammogram

## 2011-10-09 ENCOUNTER — Ambulatory Visit: Payer: Managed Care, Other (non HMO) | Admitting: Internal Medicine

## 2011-10-09 ENCOUNTER — Ambulatory Visit: Payer: Managed Care, Other (non HMO) | Admitting: Family Medicine

## 2011-10-09 ENCOUNTER — Emergency Department (HOSPITAL_COMMUNITY)
Admission: EM | Admit: 2011-10-09 | Discharge: 2011-10-09 | Disposition: A | Discharge status: Home / Self Care | Payer: Managed Care, Other (non HMO) | Attending: Emergency Medicine | Admitting: Emergency Medicine

## 2011-10-09 ENCOUNTER — Ambulatory Visit: Payer: Managed Care, Other (non HMO) | Admitting: Family

## 2011-10-09 ENCOUNTER — Telehealth: Payer: Self-pay | Admitting: Internal Medicine

## 2011-10-09 ENCOUNTER — Encounter (HOSPITAL_COMMUNITY): Payer: Self-pay | Admitting: *Deleted

## 2011-10-09 ENCOUNTER — Emergency Department (HOSPITAL_COMMUNITY): Payer: Managed Care, Other (non HMO)

## 2011-10-09 DIAGNOSIS — R51 Headache: Secondary | ICD-10-CM | POA: Insufficient documentation

## 2011-10-09 DIAGNOSIS — H8110 Benign paroxysmal vertigo, unspecified ear: Secondary | ICD-10-CM

## 2011-10-09 LAB — POCT I-STAT, CHEM 8
HCT: 43 % (ref 36.0–46.0)
Hemoglobin: 14.6 g/dL (ref 12.0–15.0)
Potassium: 3.9 mEq/L (ref 3.5–5.1)
Sodium: 140 mEq/L (ref 135–145)

## 2011-10-09 LAB — CBC
MCHC: 33.2 g/dL (ref 30.0–36.0)
RDW: 15.1 % (ref 11.5–15.5)

## 2011-10-09 MED ORDER — GADOBENATE DIMEGLUMINE 529 MG/ML IV SOLN
20.0000 mL | Freq: Once | INTRAVENOUS | Status: AC | PRN
  Administered 2011-10-09: 20 mL via INTRAVENOUS

## 2011-10-09 MED ORDER — MECLIZINE HCL 25 MG PO TABS
25.0000 mg | ORAL_TABLET | Freq: Three times a day (TID) | ORAL | Status: DC | PRN
  Administered 2011-10-09: 25 mg via ORAL
  Filled 2011-10-09: qty 1

## 2011-10-09 MED ORDER — ONDANSETRON 8 MG PO TBDP: 8.0000 mg | ORAL_TABLET | Freq: Three times a day (TID) | ORAL | Status: AC | PRN

## 2011-10-09 MED ORDER — MECLIZINE HCL 50 MG PO TABS: 25.0000 mg | ORAL_TABLET | Freq: Three times a day (TID) | ORAL | Status: DC | PRN

## 2011-10-09 MED ORDER — SODIUM CHLORIDE 0.9 % IV SOLN
INTRAVENOUS | Status: DC
  Administered 2011-10-09: 12:00:00 via INTRAVENOUS

## 2011-10-09 NOTE — ED Notes (Signed)
Patient transported to MRI 

## 2011-10-09 NOTE — Telephone Encounter (Signed)
Caller: Shannon Rose/Patient; PCP: Allena Earing); CB#: [phone]; ; ; Call regarding Dizzy; onset 10-08-11 of dizziness, occured when she was laying down and rolled over in bed and then continued when getting up  Still has it today but not quite as bad has slight head pressure.  All emergent symptoms per Dizziness protocols ruled out except for having sensations of turning or spinning that affects balance and has not responded to 4 hours of home care.   Appointment made for 1030 with Dr Abran Cantor

## 2011-10-09 NOTE — ED Notes (Signed)
Pt states yesterday morning sat up on side of bed and room started spinning, laid back in bed then tried to sit back up and room still was spinning, then states she fell back on bed, was able to go to church, then that night when laid down the room started spinning again. Pt states about a month ago had a episode where lips went numb and she came to the hospital but they didn't find anything wrong. 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 Pt states feeling dizzy and head pressure at this time, denies pain, shortness of breath or numbness/tingling anywhere, states "I just feel weird".

## 2011-10-09 NOTE — ED Provider Notes (Addendum)
History     CSN: [id number]  Arrival date & time 10/09/11  1034   First MD Initiated Contact with Patient 10/09/11 1112      Chief complaint: Dizziness   HPI Pt was talking to her friend on the phone and when she turned over her head started spinning.  She noticed that it would increase with movement but after lying down it resolved.  Last night it started again with movement.  Pt drove to work when this started again.  Pt feels really weird.  Pt states she has history of fluid on her brain and a blod clot on her brain.  SHe also states she has head pressure.  Certain movements make it worse.  Today however she still feels bad even lying flat.  No trouble with her speech or coordination.  Past Medical History  Diagnosis Date  . Headache     Past Surgical History  Procedure Date  . Cesarean section   . Tubal ligation     Family History  Problem Relation Age of Onset  . Diabetes Mother   . Heart disease Father   . Heart disease Sister     History  Substance Use Topics  . Smoking status: Never Smoker   . Smokeless tobacco: Never Used  . Alcohol Use: No    OB History    Grav Para Term Preterm Abortions TAB SAB Ect Mult Living                  Review of Systems  Neurological: Positive for headaches. Negative for seizures, syncope, speech difficulty and numbness.  All other systems reviewed and are negative.    Allergies  Review of patient's allergies indicates no known allergies.  Home Medications   Current Outpatient Rx  Name Route Sig Dispense Refill  . ALBUTEROL SULFATE HFA 108 (90 BASE) MCG/ACT IN AERS Inhalation Inhale 1-2 puffs into the lungs every 6 (six) hours as needed. Wheezing.    . IBUPROFEN 200 MG PO TABS Oral Take 200 mg by mouth every 6 (six) hours as needed. Pain.      BP 128/80  Pulse 65  Temp 98.8 F (37.1 C) (Oral)  Resp 18  Ht 5\' 6"  (1.676 m)  Wt 278 lb (126.1 kg)  BMI 44.87 kg/m2  LMP 07/09/2011  Physical Exam  Nursing note and  vitals reviewed. Constitutional: She is oriented to person, place, and time. She appears well-developed and well-nourished. No distress.  HENT:  Head: Normocephalic and atraumatic.  Right Ear: External ear normal.  Left Ear: External ear normal.  Mouth/Throat: Oropharynx is clear and moist.  Eyes: Conjunctivae are normal. Right eye exhibits no discharge. Left eye exhibits no discharge. No scleral icterus.  Neck: Neck supple. No tracheal deviation present.  Cardiovascular: Normal rate, regular rhythm and intact distal pulses.   Pulmonary/Chest: Effort normal and breath sounds normal. No stridor. No respiratory distress. She has no wheezes. She has no rales.  Abdominal: Soft. Bowel sounds are normal. She exhibits no distension. There is no tenderness. There is no rebound and no guarding.  Musculoskeletal: She exhibits no edema and no tenderness.  Neurological: She is alert and oriented to person, place, and time. She has normal strength. No sensory deficit. Cranial nerve deficit:  no gross defecits noted. She exhibits normal muscle tone. She displays no seizure activity. Coordination normal.       No pronator drift bilateral upper extrem, able to hold both legs off bed for 5 seconds, sensation  intact in all extremities, no visual field cuts, no left or right sided neglect, negative rhomberg  Skin: Skin is warm and dry. No rash noted.  Psychiatric: She has a normal mood and affect.    ED Course  Procedures (including critical care time)   Labs Reviewed  CBC  POCT I-STAT, CHEM 8   Mr Laqueta Jean Wo Contrast  10/09/2011  *RADIOLOGY REPORT*  Clinical Data: 46 year old female with headache, vertigo.  MRI HEAD WITHOUT AND WITH CONTRAST  Technique:  Multiplanar, multiecho pulse sequences of the brain and surrounding structures were obtained according to standard protocol without and with intravenous contrast  Contrast:  20 ml MultiHance.  Comparison: Head CTs 08/10/2008 and earlier.  Brain MRI  07/05/2006.  Findings: Major intracranial vascular flow voids are stable and within normal limits.  Cavum septum pellucidum/very again noted.  No ventriculomegaly. No restricted diffusion to suggest acute infarction.  No midline shift, mass effect, evidence of mass lesion, extra-axial collection or acute intracranial hemorrhage.  Cervicomedullary junction and pituitary are within normal limits.  Gray-white matter signal is within normal limits for age throughout the brain. No abnormal enhancement identified.  A No abnormal enhancement identified.  Negative visualized cervical spine.  Stable bone marrow signal, within normal limits. Visualized orbit soft tissues are within normal limits.  The paranasal sinuses are clear.  The right mastoids and tympanic cavity are clear.  There is chronic opacification of the left mastoids and middle year, stable in appearance since 2008.  Negative scalp soft tissues.  IMPRESSION: 1.  Stable and normal MRI appearance of the brain. 2.  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  Original Report Authenticated By: Harley Hallmark, M.D.      MDM  Reviewed her old imaging tests.  The MRI in the past showed possible filling defect.  The follow up angiogram did not show any sign of a filling defect.  Pt states she was told it probably dissolved and not that it was an artifact.  Pt did not feel better after the meclizine.  She was concerned about the possibility of blood clot/stroke.  MRI performed which did not show signs of acute abnormality.  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  Will dc home with treatment for vertigo.    Celene Kras, MD 10/09/11 [phone]

## 2011-10-10 ENCOUNTER — Ambulatory Visit (INDEPENDENT_AMBULATORY_CARE_PROVIDER_SITE_OTHER): Payer: Managed Care, Other (non HMO) | Admitting: Internal Medicine

## 2011-10-10 ENCOUNTER — Encounter: Payer: Self-pay | Admitting: Internal Medicine

## 2011-10-10 VITALS — BP 104/70 | HR 80 | Temp 98.1°F | Wt 276.0 lb

## 2011-10-10 DIAGNOSIS — H8110 Benign paroxysmal vertigo, unspecified ear: Secondary | ICD-10-CM

## 2011-10-10 DIAGNOSIS — R42 Dizziness and giddiness: Secondary | ICD-10-CM

## 2011-10-10 DIAGNOSIS — IMO0002 Reserved for concepts with insufficient information to code with codable children: Secondary | ICD-10-CM | POA: Insufficient documentation

## 2011-10-10 MED ORDER — DIAZEPAM 2 MG PO TABS: 2.0000 mg | ORAL_TABLET | Freq: Three times a day (TID) | ORAL | Status: AC | PRN

## 2011-10-10 NOTE — Progress Notes (Signed)
Subjective:    Patient ID: Shannon Rose, female    DOB: 1965-11-29, 46 y.o.   MRN: [id number]  HPI  46 year old [place] American female for emergency room followup. She was seen on 10/09/2011 for acute dizziness. Her symptoms started on Sunday after she bent down to pick up the phone.  She denies any preceding illness. She tried to go to church and then to work on Monday but her symptoms recurred. She has associated nausea but no vomiting. She was seen in emergency room and an MRI of brain was completed. It was negative for cerebellar stroke. She has chronic left middle ear and mastoid inflammatory changes that appear stable since 2008.  She was prescribed meclizine 50 mg one half tablet 3 times a day as needed. She has not had any improvement since taking her first dose.  She has persistent / disabling vertigo.  Her symptoms are worse with laying down.   Review of Systems Negative for vomiting, negative for headache  Past Medical History  Diagnosis Date  . Headache     History   Social History  . Marital Status: Married    Spouse Name: N/A    Number of Children: N/A  . Years of Education: N/A   Occupational History  . Not on file.   Social History Main Topics  . Smoking status: Never Smoker   . Smokeless tobacco: Never Used  . Alcohol Use: No  . Drug Use: No  . Sexually Active: Yes   Other Topics Concern  . Not on file   Social History Narrative  . No narrative on file    Past Surgical History  Procedure Date  . Cesarean section   . Tubal ligation     Family History  Problem Relation Age of Onset  . Diabetes Mother   . Heart disease Father   . Heart disease Sister     No Known Allergies  Current Outpatient Prescriptions on File Prior to Visit  Medication Sig Dispense Refill  . albuterol (PROVENTIL HFA;VENTOLIN HFA) 108 (90 BASE) MCG/ACT inhaler Inhale 1-2 puffs into the lungs every 6 (six) hours as needed. Wheezing.      Marland Kitchen ibuprofen (ADVIL,MOTRIN) 200  MG tablet Take 200 mg by mouth every 6 (six) hours as needed. Pain.      . meclizine (ANTIVERT) 50 MG tablet Take 0.5 tablets (25 mg total) by mouth 3 (three) times daily as needed.  30 tablet  0  . ondansetron (ZOFRAN ODT) 8 MG disintegrating tablet Take 1 tablet (8 mg total) by mouth every 8 (eight) hours as needed for nausea.  20 tablet  0   Current Facility-Administered Medications on File Prior to Visit  Medication Dose Route Frequency Provider Last Rate Last Dose  . gadobenate dimeglumine (MULTIHANCE) injection 20 mL  20 mL Intravenous Once PRN Medication Radiologist, MD   20 mL at 10/09/11 1514  . DISCONTD: 0.9 %  sodium chloride infusion   Intravenous Continuous Celene Kras, MD 125 mL/hr at 10/09/11 1214    . DISCONTD: meclizine (ANTIVERT) tablet 25 mg  25 mg Oral TID PRN Celene Kras, MD   25 mg at 10/09/11 1203    BP 104/70  Pulse 80  Temp 98.1 F (36.7 C) (Oral)  Wt 276 lb (125.193 kg)  LMP 07/09/2011       Objective:   Physical Exam  Constitutional: She is oriented to person, place, and time. She appears well-developed and well-nourished. No distress.  HENT:  Head: Normocephalic and atraumatic.  Mouth/Throat: Oropharynx is clear and moist.       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       Negative for nystagmus  Neck: Neck supple.  Cardiovascular: Normal rate, regular rhythm and normal heart sounds.   Pulmonary/Chest: Effort normal and breath sounds normal. She has no wheezes.  Lymphadenopathy:    She has no cervical adenopathy.  Neurological: She is alert and oriented to person, place, and time.       Unstable gait due to vertigo  Psychiatric: She has a normal mood and affect. Her behavior is normal.       Assessment & Plan:

## 2011-10-10 NOTE — Assessment & Plan Note (Signed)
46 year old [place] American female experiencing disabling positional vertigo grade MRI of brain was normal. Meclizine minimally helpful. Switch to diazepam 2 mg every 8 hours as needed. Refer to vestibular rehabilitation. Reassess in one week.

## 2011-10-11 ENCOUNTER — Ambulatory Visit: Payer: Managed Care, Other (non HMO) | Attending: Internal Medicine | Admitting: Physical Therapy

## 2011-10-11 DIAGNOSIS — H8110 Benign paroxysmal vertigo, unspecified ear: Secondary | ICD-10-CM | POA: Insufficient documentation

## 2011-10-11 DIAGNOSIS — IMO0001 Reserved for inherently not codable concepts without codable children: Secondary | ICD-10-CM | POA: Insufficient documentation

## 2011-10-12 ENCOUNTER — Ambulatory Visit: Payer: Managed Care, Other (non HMO) | Admitting: Physical Therapy

## 2011-10-17 ENCOUNTER — Telehealth: Payer: Self-pay | Admitting: Internal Medicine

## 2011-10-17 NOTE — Telephone Encounter (Signed)
Pt needs letter stating it is ok for her to go back to work today. Pt gave them a note stating she can go back to work today but they are not excepting it employer states that it needs to say she is clear to start work today. Fax to [phone]

## 2011-10-17 NOTE — Telephone Encounter (Signed)
Pt was d/c from PT but she has had some symptoms on and off.  She already had a note from Dr Artist Pais stating she could go back to work but her job needed clarification.  Spoke to Dr Artist Pais and he said it was to return to work.  Faxed note to pts job.

## 2011-10-30 ENCOUNTER — Ambulatory Visit (INDEPENDENT_AMBULATORY_CARE_PROVIDER_SITE_OTHER): Payer: Managed Care, Other (non HMO) | Admitting: Internal Medicine

## 2011-10-30 VITALS — BP 102/62 | Temp 98.7°F | Wt 272.0 lb

## 2011-10-30 DIAGNOSIS — H6590 Unspecified nonsuppurative otitis media, unspecified ear: Secondary | ICD-10-CM

## 2011-10-30 MED ORDER — AMOXICILLIN-POT CLAVULANATE 875-125 MG PO TABS: 1.0000 | ORAL_TABLET | Freq: Two times a day (BID) | ORAL | Status: AC

## 2011-10-30 NOTE — Assessment & Plan Note (Signed)
46 year old American female with left facial and ear tenderness. Her symptoms may be secondary to serous otitis media. Treat with Augmentin 875/125 twice daily for 10 days.  Patient advised to call office if symptoms persist or worsen.

## 2011-10-30 NOTE — Progress Notes (Signed)
  Subjective:    Patient ID: Shannon Rose, female    DOB: [birth year], 46 y.o.   MRN: [id number]  HPI  46 year old American female previously seen for vertigo followup. Patient was seen by vestibular rehabilitation. Her vertigo symptoms have significantly improved. However over the past 48 hours patient complains of swelling and tenderness left side of her face near her ear. Patient also reports tenderness of her left upper neck. She denies fever or chills. She denies any dental issues.  MRI of brain 10/09/2011 - Reviewed There is chronic opacification of the left mastoids and middle year, stable in appearance since 2008. Negative scalp soft tissues.   Review of Systems She has mild intermittent dizziness  Past Medical History  Diagnosis Date  . Headache     History   Social History  . Marital Status: Married    Spouse Name: N/A    Number of Children: N/A  . Years of Education: N/A   Occupational History  . Not on file.   Social History Main Topics  . Smoking status: Never Smoker   . Smokeless tobacco: Never Used  . Alcohol Use: No  . Drug Use: No  . Sexually Active: Yes   Other Topics Concern  . Not on file   Social History Narrative  . No narrative on file    Past Surgical History  Procedure Date  . Cesarean section   . Tubal ligation     Family History  Problem Relation Age of Onset  . Diabetes Mother   . Heart disease Father   . Heart disease Sister     No Known Allergies  Current Outpatient Prescriptions on File Prior to Visit  Medication Sig Dispense Refill  . albuterol (PROVENTIL HFA;VENTOLIN HFA) 108 (90 BASE) MCG/ACT inhaler Inhale 1-2 puffs into the lungs every 6 (six) hours as needed. Wheezing.      Marland Kitchen ibuprofen (ADVIL,MOTRIN) 200 MG tablet Take 200 mg by mouth every 6 (six) hours as needed. Pain.        BP 102/62  Temp 98.7 F (37.1 C) (Oral)  Wt 272 lb (123.378 kg)  LMP 07/09/2011       Objective:   Physical Exam  Constitutional:  She is oriented to person, place, and time. She appears well-developed and well-nourished.  HENT:  Head: Normocephalic and atraumatic.  Right Ear: External ear normal.       Chronic scarring of the tympanic membrane, no mastoid tenderness  Eyes: EOM are normal. Pupils are equal, round, and reactive to light.  Neck: Neck supple.       Left upper neck tenderness, no adenopathy Slight fullness of left upper neck  Cardiovascular: Normal rate, regular rhythm and normal heart sounds.   Pulmonary/Chest: Effort normal and breath sounds normal. She has no wheezes.  Neurological: She is alert and oriented to person, place, and time.          Assessment & Plan:

## 2012-05-01 IMAGING — CR DG CHEST 2V
2 series · 2 of 2 positions shown · non-contrast
Comparison: 06/26/2010

CLINICAL DATA: Dizziness.

CHEST - 2 VIEW

[w chest pa]
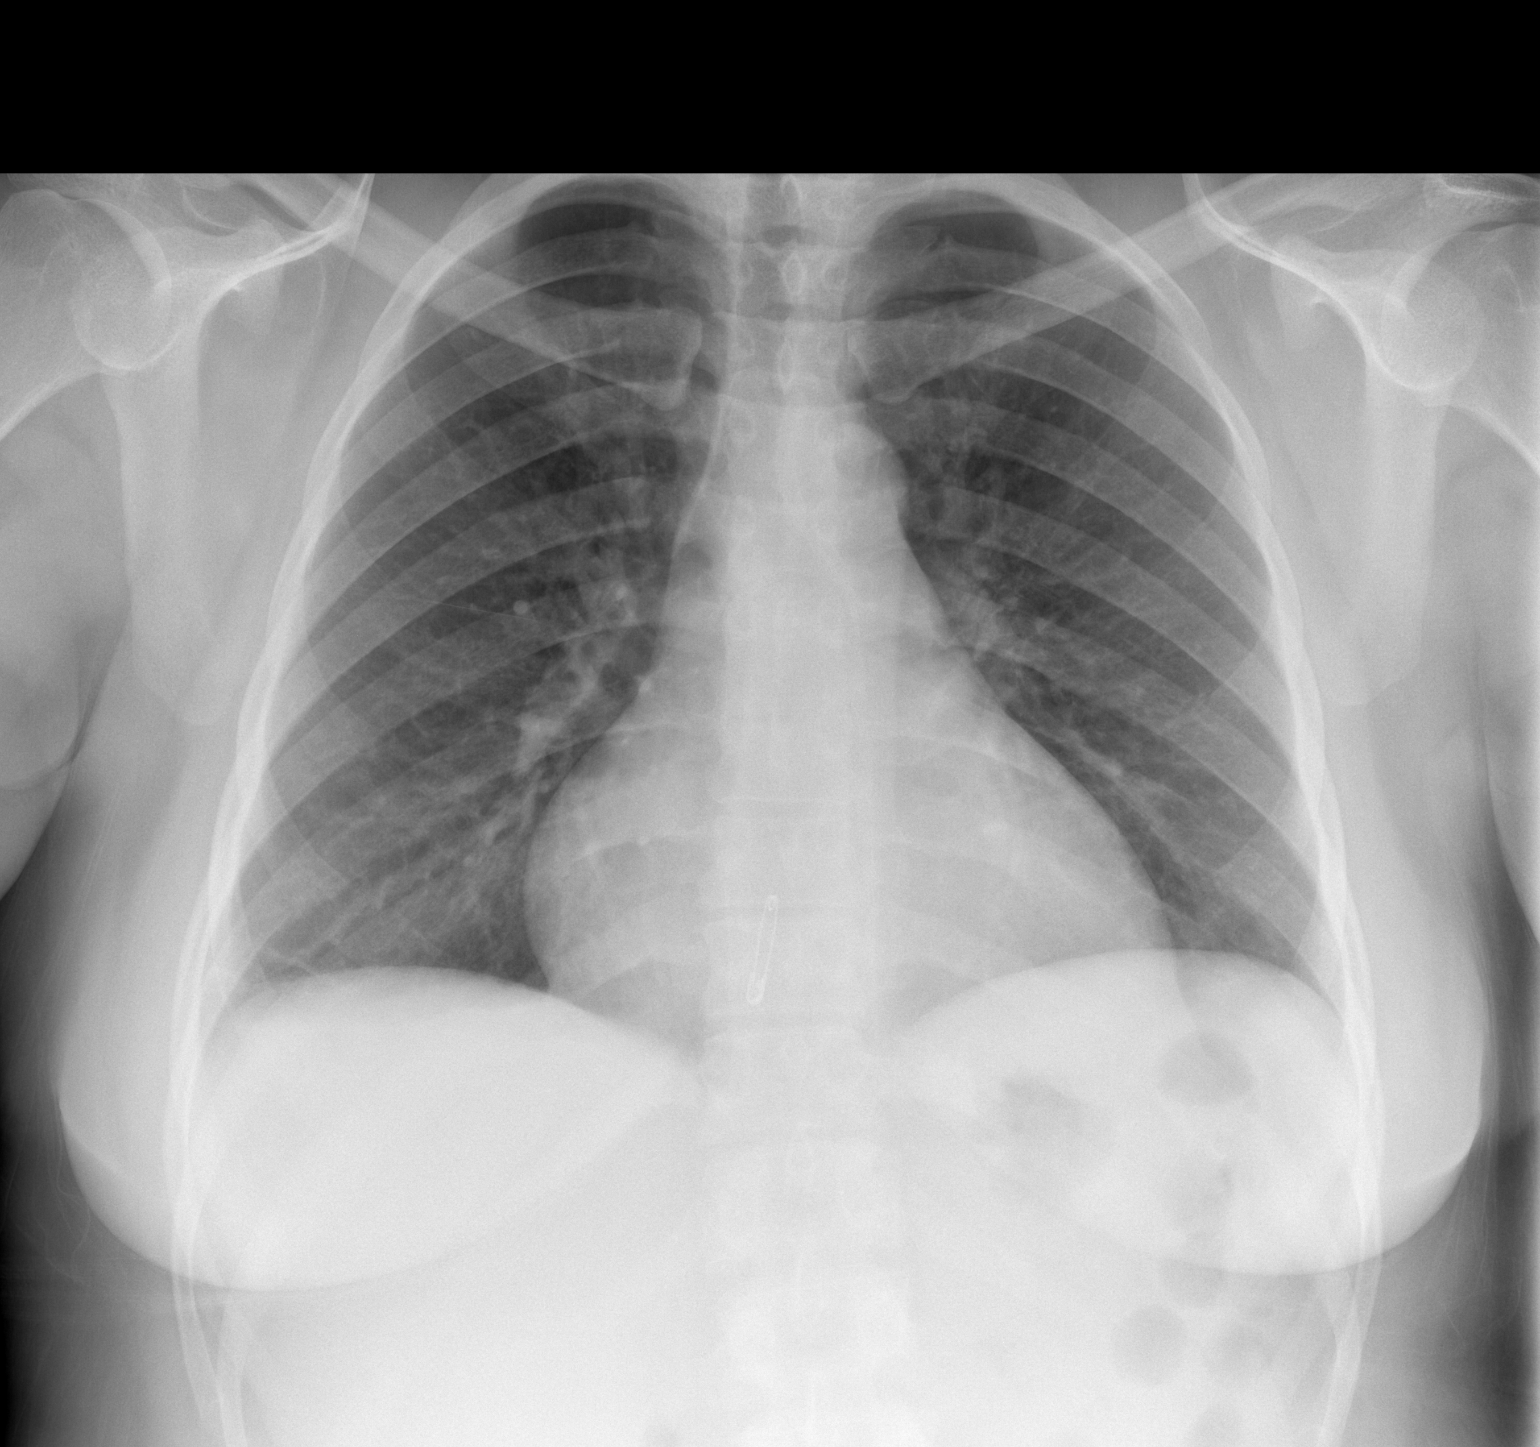

[w chest lat]
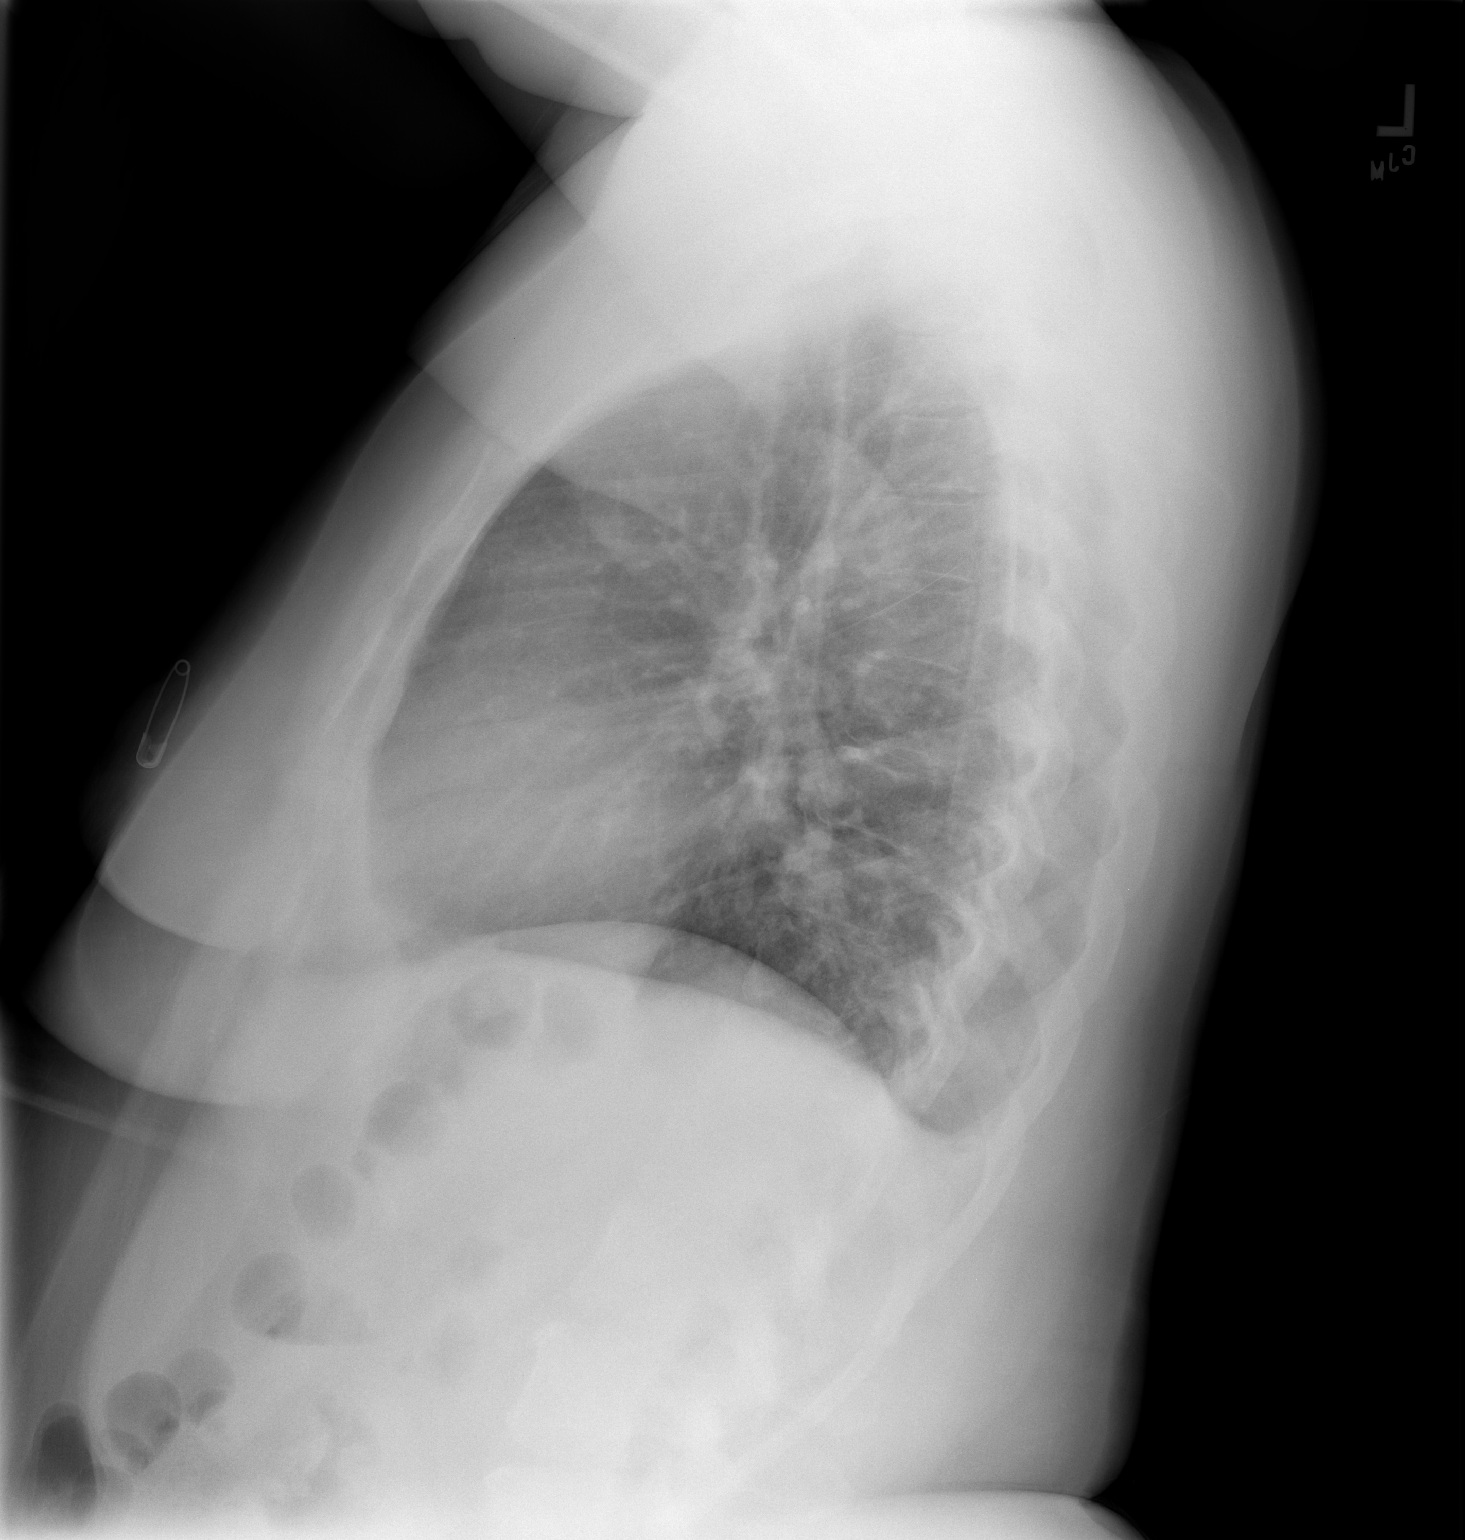

[2 of 2 positions shown; findings below may reference images not displayed]

FINDINGS: Heart size remains within normal limits.  Both lungs are
clear.  No evidence of pleural effusion, mass, or lymphadenopathy.
IMPRESSION: No active disease.

## 2014-10-04 ENCOUNTER — Emergency Department (HOSPITAL_COMMUNITY)
Admission: EM | Admit: 2014-10-04 | Discharge: 2014-10-05 | Disposition: A | Discharge status: Home / Self Care | Payer: BLUE CROSS/BLUE SHIELD | Attending: Emergency Medicine | Admitting: Emergency Medicine

## 2014-10-04 ENCOUNTER — Encounter (HOSPITAL_COMMUNITY): Payer: Self-pay | Admitting: Emergency Medicine

## 2014-10-04 DIAGNOSIS — R509 Fever, unspecified: Secondary | ICD-10-CM | POA: Insufficient documentation

## 2014-10-04 DIAGNOSIS — J029 Acute pharyngitis, unspecified: Secondary | ICD-10-CM | POA: Insufficient documentation

## 2014-10-04 DIAGNOSIS — R109 Unspecified abdominal pain: Secondary | ICD-10-CM | POA: Diagnosis not present

## 2014-10-04 DIAGNOSIS — R7989 Other specified abnormal findings of blood chemistry: Secondary | ICD-10-CM | POA: Insufficient documentation

## 2014-10-04 NOTE — ED Notes (Signed)
Pt reports throat pain x2 days, with abdominal pain tonight. Pt denies n/v/d. Pt reports taking family members Ampicillin for fever/chills at home. No known sick contacts. Pt refusing to answer questions and has family answer all d/t throat pain

## 2014-10-05 ENCOUNTER — Emergency Department (HOSPITAL_COMMUNITY): Payer: BLUE CROSS/BLUE SHIELD

## 2014-10-05 LAB — RAPID STREP SCREEN (MED CTR MEBANE ONLY): STREPTOCOCCUS, GROUP A SCREEN (DIRECT): NEGATIVE

## 2014-10-05 LAB — CBC WITH DIFFERENTIAL/PLATELET
BASOS PCT: 0 % (ref 0–1)
Basophils Absolute: 0 10*3/uL (ref 0.0–0.1)
EOS ABS: 0 10*3/uL (ref 0.0–0.7)
Eosinophils Relative: 0 % (ref 0–5)
HCT: 43.1 % (ref 36.0–46.0)
Hemoglobin: 14.4 g/dL (ref 12.0–15.0)
LYMPHS PCT: 7 % — AB (ref 12–46)
Lymphs Abs: 1.2 10*3/uL (ref 0.7–4.0)
MCH: 26.9 pg (ref 26.0–34.0)
MCHC: 33.4 g/dL (ref 30.0–36.0)
MCV: 80.4 fL (ref 78.0–100.0)
MONOS PCT: 5 % (ref 3–12)
Monocytes Absolute: 0.8 10*3/uL (ref 0.1–1.0)
NEUTROS ABS: 14.9 10*3/uL — AB (ref 1.7–7.7)
NEUTROS PCT: 88 % — AB (ref 43–77)
PLATELETS: UNDETERMINED 10*3/uL (ref 150–400)
RBC: 5.36 MIL/uL — ABNORMAL HIGH (ref 3.87–5.11)
RDW: 14.7 % (ref 11.5–15.5)
WBC: 16.9 10*3/uL — AB (ref 4.0–10.5)

## 2014-10-05 LAB — COMPREHENSIVE METABOLIC PANEL
ALK PHOS: 53 U/L (ref 38–126)
ALT: 12 U/L — AB (ref 14–54)
ANION GAP: 7 (ref 5–15)
AST: 19 U/L (ref 15–41)
Albumin: 3.6 g/dL (ref 3.5–5.0)
BUN: 10 mg/dL (ref 6–20)
CHLORIDE: 103 mmol/L (ref 101–111)
CO2: 24 mmol/L (ref 22–32)
CREATININE: 0.99 mg/dL (ref 0.44–1.00)
Calcium: 8.6 mg/dL — ABNORMAL LOW (ref 8.9–10.3)
GFR calc Af Amer: >60 mL/min (ref >60)
GFR calc non Af Amer: >60 mL/min (ref >60)
Glucose, Bld: 152 mg/dL — ABNORMAL HIGH (ref 65–99)
Potassium: 4 mmol/L (ref 3.5–5.1)
Sodium: 134 mmol/L — ABNORMAL LOW (ref 135–145)
Total Bilirubin: 0.8 mg/dL (ref 0.3–1.2)
Total Protein: 8.6 g/dL — ABNORMAL HIGH (ref 6.5–8.1)

## 2014-10-05 LAB — URINALYSIS, ROUTINE W REFLEX MICROSCOPIC
Bilirubin Urine: NEGATIVE
Glucose, UA: NEGATIVE mg/dL
HGB URINE DIPSTICK: NEGATIVE
Ketones, ur: NEGATIVE mg/dL
LEUKOCYTES UA: NEGATIVE
NITRITE: NEGATIVE
PH: 5 (ref 5.0–8.0)
Protein, ur: NEGATIVE mg/dL
SPECIFIC GRAVITY, URINE: 1.013 (ref 1.005–1.030)
UROBILINOGEN UA: 0.2 mg/dL (ref 0.0–1.0)

## 2014-10-05 LAB — LIPASE, BLOOD: Lipase: 15 U/L — ABNORMAL LOW (ref 22–51)

## 2014-10-05 LAB — I-STAT CG4 LACTIC ACID, ED
LACTIC ACID, VENOUS: 2.23 mmol/L — AB (ref 0.5–2.0)
Lactic Acid, Venous: 0.82 mmol/L (ref 0.5–2.0)

## 2014-10-05 MED ORDER — AMOXICILLIN 500 MG PO CAPS: 1000.0000 mg | ORAL_CAPSULE | Freq: Two times a day (BID) | ORAL | Status: AC

## 2014-10-05 MED ORDER — SODIUM CHLORIDE 0.9 % IV SOLN: 1000.0000 mL | INTRAVENOUS | Status: DC

## 2014-10-05 MED ORDER — DEXAMETHASONE SODIUM PHOSPHATE 10 MG/ML IJ SOLN
10.0000 mg | Freq: Once | INTRAMUSCULAR | Status: AC
  Administered 2014-10-05: 10 mg via INTRAVENOUS
  Filled 2014-10-05: qty 1

## 2014-10-05 MED ORDER — SODIUM CHLORIDE 0.9 % IV SOLN: 1000.0000 mL | Freq: Once | INTRAVENOUS | Status: DC

## 2014-10-05 MED ORDER — ONDANSETRON HCL 4 MG/2ML IJ SOLN
4.0000 mg | Freq: Once | INTRAMUSCULAR | Status: AC
  Administered 2014-10-05: 4 mg via INTRAVENOUS
  Filled 2014-10-05: qty 2

## 2014-10-05 MED ORDER — CEFTRIAXONE SODIUM 1 G IJ SOLR
1.0000 g | Freq: Once | INTRAMUSCULAR | Status: AC
  Administered 2014-10-05: 1 g via INTRAVENOUS
  Filled 2014-10-05: qty 10

## 2014-10-05 MED ORDER — ACETAMINOPHEN 325 MG PO TABS
650.0000 mg | ORAL_TABLET | Freq: Once | ORAL | Status: AC
  Administered 2014-10-05: 650 mg via ORAL
  Filled 2014-10-05: qty 2

## 2014-10-05 NOTE — Discharge Instructions (Signed)
Your test did not show any clear cause for infection. However, the dose of anabolic you, as night may render some of the tests unreliable. You're being given a prescription for an antibody today. Please take it until it is completely gone. Make sure to drink plenty of fluids. Return to the emergency department if symptoms are getting worse.  Pharyngitis Pharyngitis is redness, pain, and swelling (inflammation) of your pharynx.  CAUSES  Pharyngitis is usually caused by infection. Most of the time, these infections are from viruses (viral) and are part of a cold. However, sometimes pharyngitis is caused by bacteria (bacterial). Pharyngitis can also be caused by allergies. Viral pharyngitis may be spread from person to person by coughing, sneezing, and personal items or utensils (cups, forks, spoons, toothbrushes). Bacterial pharyngitis may be spread from person to person by more intimate contact, such as kissing.  SIGNS AND SYMPTOMS  Symptoms of pharyngitis include:   Sore throat.   Tiredness (fatigue).   Low-grade fever.   Headache.  Joint pain and muscle aches.  Skin rashes.  Swollen lymph nodes.  Plaque-like film on throat or tonsils (often seen with bacterial pharyngitis). DIAGNOSIS  Your health care provider will ask you questions about your illness and your symptoms. Your medical history, along with a physical exam, is often all that is needed to diagnose pharyngitis. Sometimes, a rapid strep test is done. Other lab tests may also be done, depending on the suspected cause.  TREATMENT  Viral pharyngitis will usually get better in 3-4 days without the use of medicine. Bacterial pharyngitis is treated with medicines that kill germs (antibiotics).  HOME CARE INSTRUCTIONS   Drink enough water and fluids to keep your urine clear or pale yellow.   Only take over-the-counter or prescription medicines as directed by your health care provider:   If you are prescribed antibiotics,  make sure you finish them even if you start to feel better.   Do not take aspirin.   Get lots of rest.   Gargle with 8 oz of salt water ( tsp of salt per 1 qt of water) as often as every 1-2 hours to soothe your throat.   Throat lozenges (if you are not at risk for choking) or sprays may be used to soothe your throat. SEEK MEDICAL CARE IF:   You have large, tender lumps in your neck.  You have a rash.  You cough up green, yellow-brown, or bloody spit. SEEK IMMEDIATE MEDICAL CARE IF:   Your neck becomes stiff.  You drool or are unable to swallow liquids.  You vomit or are unable to keep medicines or liquids down.  You have severe pain that does not go away with the use of recommended medicines.  You have trouble breathing (not caused by a stuffy nose). MAKE SURE YOU:   Understand these instructions.  Will watch your condition.  Will get help right away if you are not doing well or get worse. Document Released: 02/20/2005 Document Revised: 12/11/2012 Document Reviewed: 10/28/2012 [facility] Patient Information 2015 [place], [place]. This information is not intended to replace advice given to you by your health care provider. Make sure you discuss any questions you have with your health care provider.  Amoxicillin capsules or tablets What is this medicine? AMOXICILLIN (a mox i SIL in) is a penicillin antibiotic. It is used to treat certain kinds of bacterial infections. It will not work for colds, flu, or other viral infections. This medicine may be used for other purposes; ask your  health care provider or pharmacist if you have questions. COMMON BRAND NAME(S): Amoxil, Moxilin, Sumox, Trimox What should I tell my health care provider before I take this medicine? They need to know if you have any of these conditions: -asthma -kidney disease -an unusual or allergic reaction to amoxicillin, other penicillins, cephalosporin antibiotics, other medicines, foods, dyes, or  preservatives -pregnant or trying to get pregnant -breast-feeding How should I use this medicine? Take this medicine by mouth with a glass of water. Follow the directions on your prescription label. You may take this medicine with food or on an empty stomach. Take your medicine at regular intervals. Do not take your medicine more often than directed. Take all of your medicine as directed even if you think your are better. Do not skip doses or stop your medicine early. Talk to your pediatrician regarding the use of this medicine in children. While this drug may be prescribed for selected conditions, precautions do apply. Overdosage: If you think you have taken too much of this medicine contact a poison control center or emergency room at once. NOTE: This medicine is only for you. Do not share this medicine with others. What if I miss a dose? If you miss a dose, take it as soon as you can. If it is almost time for your next dose, take only that dose. Do not take double or extra doses. What may interact with this medicine? -amiloride -birth control pills -chloramphenicol -macrolides -probenecid -sulfonamides -tetracyclines This list may not describe all possible interactions. Give your health care provider a list of all the medicines, herbs, non-prescription drugs, or dietary supplements you use. Also tell them if you smoke, drink alcohol, or use illegal drugs. Some items may interact with your medicine. What should I watch for while using this medicine? Tell your doctor or health care professional if your symptoms do not improve in 2 or 3 days. Take all of the doses of your medicine as directed. Do not skip doses or stop your medicine early. If you are diabetic, you may get a false positive result for sugar in your urine with certain brands of urine tests. Check with your doctor. Do not treat diarrhea with over-the-counter products. Contact your doctor if you have diarrhea that lasts more than 2  days or if the diarrhea is severe and watery. What side effects may I notice from receiving this medicine? Side effects that you should report to your doctor or health care professional as soon as possible: -allergic reactions like skin rash, itching or hives, swelling of the face, lips, or tongue -breathing problems -dark urine -redness, blistering, peeling or loosening of the skin, including inside the mouth -seizures -severe or watery diarrhea -trouble passing urine or change in the amount of urine -unusual bleeding or bruising -unusually weak or tired -yellowing of the eyes or skin Side effects that usually do not require medical attention (report to your doctor or health care professional if they continue or are bothersome): -dizziness -headache -stomach upset -trouble sleeping This list may not describe all possible side effects. Call your doctor for medical advice about side effects. You may report side effects to FDA at 1-800-FDA-1088. Where should I keep my medicine? Keep out of the reach of children. Store between 68 and 77 degrees F (20 and 25 degrees C). Keep bottle closed tightly. Throw away any unused medicine after the expiration date. NOTE: This sheet is a summary. It may not cover all possible information. If you have questions about  this medicine, talk to your doctor, pharmacist, or health care provider.  2015, [employer]. (2007-05-14 14:10:59)

## 2014-10-05 NOTE — ED Provider Notes (Signed)
CSN: [id number]     Arrival date & time 10/04/14  2336 History   First MD Initiated Contact with Patient 10/05/14 0015     Chief Complaint  Patient presents with  . Sore Throat  . Abdominal Pain     (Consider location/radiation/quality/duration/timing/severity/associated sxs/prior Treatment) Patient is a 49 y.o. female presenting with pharyngitis and abdominal pain. The history is provided by the patient and a relative.  Sore Throat Associated symptoms include abdominal pain.  Abdominal Pain She has had a sore throat for the last 2 days. Today, family noted that she was more drowsy than normal and started complaining of abdominal pain. However, when I asked the patient, she states that she has nausea but no abdominal pain. Family did not check her temperature. There's been no cough. She has not had any chills or sweats and there's been no vomiting. Family member gave her a dose of amoxicillin last night, but no other medication. She just returned from a trip to [place].  Past Medical History  Diagnosis Date  . NWGNFAOZ(308.6)    Past Surgical History  Procedure Laterality Date  . Cesarean section    . Tubal ligation     Family History  Problem Relation Age of Onset  . Diabetes Mother   . Heart disease Father   . Heart disease Sister    History  Substance Use Topics  . Smoking status: Never Smoker   . Smokeless tobacco: Never Used  . Alcohol Use: No   OB History    No data available     Review of Systems  Gastrointestinal: Positive for abdominal pain.  All other systems reviewed and are negative.     Allergies  Review of patient's allergies indicates no known allergies.  Home Medications   Prior to Admission medications   Medication Sig Start Date End Date Taking? Authorizing Provider  AMPICILLIN PO Take 0.5 tablets by mouth once.   Yes Historical Provider, MD  guaiFENesin (ROBITUSSIN) 100 MG/5ML liquid Take 200 mg by mouth 3 (three) times daily as  needed for cough.   Yes Historical Provider, MD  ibuprofen (ADVIL,MOTRIN) 200 MG tablet Take 100 mg by mouth every 6 (six) hours as needed for moderate pain. Pain.   Yes Historical Provider, MD  albuterol (PROVENTIL HFA;VENTOLIN HFA) 108 (90 BASE) MCG/ACT inhaler Inhale 1-2 puffs into the lungs every 6 (six) hours as needed. Wheezing.    Historical Provider, MD   BP 113/68 mmHg  Pulse 103  Temp(Src) 102 F (38.9 C) (Oral)  Resp 14  SpO2 96%  LMP 09/03/2014 (Approximate) Physical Exam  Nursing note and vitals reviewed.  49 year old female, resting comfortably and in no acute distress. Vital signs are significant for fever and tachycardia. Oxygen saturation is 96%, which is normal. She is sleepy but arousable and does answer questions appropriately. Head is normocephalic and atraumatic. PERRLA, EOMI. Oropharynx is mildly erythematous. She is tolerating secretions well. Neck is nontender and supple without adenopathy or JVD. Back is nontender and there is no CVA tenderness. Lungs are clear without rales, wheezes, or rhonchi. Chest is nontender. Heart has regular rate and rhythm without murmur. Abdomen is soft, flat, nontender without masses or hepatosplenomegaly and peristalsis is hypoactive. Extremities have no cyanosis or edema, full range of motion is present. Skin is warm and dry without rash. Neurologic: Mental status is normal, cranial nerves are intact, there are no motor or sensory deficits.  ED Course  Procedures (including critical care time) Labs Review  Results for orders placed or performed during the hospital encounter of 10/04/14  Rapid strep screen (not at [facility])  Result Value Ref Range   Streptococcus, Group A Screen (Direct) NEGATIVE NEGATIVE  Lipase, blood  Result Value Ref Range   Lipase 15 (L) 22 - 51 U/L  Comprehensive metabolic panel  Result Value Ref Range   Sodium 134 (L) 135 - 145 mmol/L   Potassium 4.0 3.5 - 5.1 mmol/L   Chloride 103 101 - 111 mmol/L    CO2 24 22 - 32 mmol/L   Glucose, Bld 152 (H) 65 - 99 mg/dL   BUN 10 6 - 20 mg/dL   Creatinine, Ser 1.61 0.44 - 1.00 mg/dL   Calcium 8.6 (L) 8.9 - 10.3 mg/dL   Total Protein 8.6 (H) 6.5 - 8.1 g/dL   Albumin 3.6 3.5 - 5.0 g/dL   AST 19 15 - 41 U/L   ALT 12 (L) 14 - 54 U/L   Alkaline Phosphatase 53 38 - 126 U/L   Total Bilirubin 0.8 0.3 - 1.2 mg/dL   GFR calc non Af Amer >60 >60 mL/min   GFR calc Af Amer >60 >60 mL/min   Anion gap 7 5 - 15  Urinalysis, Routine w reflex microscopic (not at [facility])  Result Value Ref Range   Color, Urine YELLOW YELLOW   APPearance CLOUDY (A) CLEAR   Specific Gravity, Urine 1.013 1.005 - 1.030   pH 5.0 5.0 - 8.0   Glucose, UA NEGATIVE NEGATIVE mg/dL   Hgb urine dipstick NEGATIVE NEGATIVE   Bilirubin Urine NEGATIVE NEGATIVE   Ketones, ur NEGATIVE NEGATIVE mg/dL   Protein, ur NEGATIVE NEGATIVE mg/dL   Urobilinogen, UA 0.2 0.0 - 1.0 mg/dL   Nitrite NEGATIVE NEGATIVE   Leukocytes, UA NEGATIVE NEGATIVE  CBC with Differential/Platelet  Result Value Ref Range   WBC 16.9 (H) 4.0 - 10.5 K/uL   RBC 5.36 (H) 3.87 - 5.11 MIL/uL   Hemoglobin 14.4 12.0 - 15.0 g/dL   HCT 09.6 04.5 - 40.9 %   MCV 80.4 78.0 - 100.0 fL   MCH 26.9 26.0 - 34.0 pg   MCHC 33.4 30.0 - 36.0 g/dL   RDW 81.1 91.4 - 78.2 %   Platelets PLATELET CLUMPS NOTED ON SMEAR, UNABLE TO ESTIMATE 150 - 400 K/uL   Neutrophils Relative % 88 (H) 43 - 77 %   Neutro Abs 14.9 (H) 1.7 - 7.7 K/uL   Lymphocytes Relative 7 (L) 12 - 46 %   Lymphs Abs 1.2 0.7 - 4.0 K/uL   Monocytes Relative 5 3 - 12 %   Monocytes Absolute 0.8 0.1 - 1.0 K/uL   Eosinophils Relative 0 0 - 5 %   Eosinophils Absolute 0.0 0.0 - 0.7 K/uL   Basophils Relative 0 0 - 1 %   Basophils Absolute 0.0 0.0 - 0.1 K/uL  I-Stat CG4 Lactic Acid, ED  Result Value Ref Range   Lactic Acid, Venous 2.23 (HH) 0.5 - 2.0 mmol/L   Comment NOTIFIED PHYSICIAN   I-Stat CG4 Lactic Acid, ED  Result Value Ref Range   Lactic Acid, Venous 0.82 0.5 - 2.0  mmol/L   Imaging Review Dg Chest Port 1 View  10/05/2014   CLINICAL DATA:  Fever.  Unspecified source.  EXAM: PORTABLE CHEST - 1 VIEW  COMPARISON:  10/04/2010  FINDINGS: Lung volumes are low. The heart is prominent in size. Crowding of bronchovascular structures, likely accentuated by low lung volumes. No consolidation to suggest pneumonia. No pleural effusion  or pneumothorax. No acute osseous abnormalities.  IMPRESSION: Hypoventilatory chest with crowding of bronchovascular structures. Bronchial thickening/bronchitis is difficult to evaluate given low lung volumes. No evidence of pneumonia.   Electronically Signed   By: Rubye Oaks M.D.   On: 10/05/2014 01:02   MDM   Final diagnoses:  Fever, unspecified fever cause  Pharyngitis  Elevated lactic acid level    Fever with sore throat. This is most likely a viral illness, but septic evaluation is initiated. She's been given IV fluids and acetaminophen for fever.  Following above-noted treatment, she is feeling much better. WBC is noted to be elevated with a left shift, but she is nontoxic appearing. Lactic acid was slightly elevated. Following hydration, but this was repeated and is back down to normal. Strep screen is noted to be an negative, but could be falsely negative because of anabolic administered last night. This also may make blood cultures unreliable. Decision is made to discharge her with a prescription for an antibody. She's given a prescription for amoxicillin and is given initial dose of ceftriaxone in the ED. She is also given a dose of dexamethasone to help treat her pharyngitis. She is to return should symptoms worsen.  Dione Booze, MD 10/05/14 [phone]

## 2014-10-05 NOTE — ED Notes (Signed)
RN notifed. Pt aware that a urine sample is needed.  Pt did not verbalize weather or not she was able at this time, but was informed to press call bell when she felt the urge.

## 2014-10-05 NOTE — ED Notes (Signed)
Family at bedside, states pt came back from beach today. She drove by from the beach today. Pt is weak, [email or web address]

## 2014-10-06 LAB — URINE CULTURE

## 2014-10-08 LAB — CULTURE, GROUP A STREP: STREP A CULTURE: NEGATIVE

## 2014-10-10 LAB — CULTURE, BLOOD (ROUTINE X 2)
Culture: NO GROWTH
Culture: NO GROWTH
